# Patient Record
Sex: Male | Born: 1955 | Race: Black or African American | Hispanic: No | Marital: Single | Smoking: Current every day smoker
Health system: Southern US, Community
[De-identification: ages and names within clinical notes are randomized; demographics above are authoritative.]

## PROBLEM LIST (undated history)

## (undated) DIAGNOSIS — K219 Gastro-esophageal reflux disease without esophagitis: Secondary | ICD-10-CM

## (undated) DIAGNOSIS — Z720 Tobacco use: Secondary | ICD-10-CM

## (undated) DIAGNOSIS — E7800 Pure hypercholesterolemia, unspecified: Secondary | ICD-10-CM

## (undated) HISTORY — PX: SHOULDER ARTHROSCOPY: SHX128

---

## 2013-08-04 ENCOUNTER — Encounter (HOSPITAL_COMMUNITY): Payer: Self-pay | Admitting: Emergency Medicine

## 2013-08-04 ENCOUNTER — Emergency Department (HOSPITAL_COMMUNITY): Payer: Medicare Other

## 2013-08-04 ENCOUNTER — Emergency Department (HOSPITAL_COMMUNITY)
Admission: EM | Admit: 2013-08-04 | Discharge: 2013-08-04 | Disposition: A | Discharge status: Home / Self Care | Payer: Medicare Other | Attending: Emergency Medicine | Admitting: Emergency Medicine

## 2013-08-04 DIAGNOSIS — R51 Headache: Secondary | ICD-10-CM | POA: Insufficient documentation

## 2013-08-04 DIAGNOSIS — F1010 Alcohol abuse, uncomplicated: Secondary | ICD-10-CM | POA: Insufficient documentation

## 2013-08-04 DIAGNOSIS — J329 Chronic sinusitis, unspecified: Secondary | ICD-10-CM | POA: Diagnosis not present

## 2013-08-04 DIAGNOSIS — F10929 Alcohol use, unspecified with intoxication, unspecified: Secondary | ICD-10-CM

## 2013-08-04 DIAGNOSIS — R519 Headache, unspecified: Secondary | ICD-10-CM

## 2013-08-04 LAB — CBC
HEMATOCRIT: 45.3 % (ref 39.0–52.0)
Hemoglobin: 16.3 g/dL (ref 13.0–17.0)
MCH: 33.3 pg (ref 26.0–34.0)
MCHC: 36 g/dL (ref 30.0–36.0)
MCV: 92.6 fL (ref 78.0–100.0)
PLATELETS: 315 10*3/uL (ref 150–400)
RBC: 4.89 MIL/uL (ref 4.22–5.81)
RDW: 14 % (ref 11.5–15.5)
WBC: 9.8 10*3/uL (ref 4.0–10.5)

## 2013-08-04 LAB — I-STAT TROPONIN, ED: Troponin i, poc: 0.01 ng/mL (ref 0.00–0.08)

## 2013-08-04 LAB — ETHANOL: Alcohol, Ethyl (B): 191 mg/dL — ABNORMAL HIGH (ref 0–11)

## 2013-08-04 MED ORDER — SODIUM CHLORIDE 0.9 % IV BOLUS (SEPSIS)
1000.0000 mL | Freq: Once | INTRAVENOUS | Status: AC
  Administered 2013-08-04: 1000 mL via INTRAVENOUS

## 2013-08-04 MED ORDER — PSEUDOEPHEDRINE HCL ER 120 MG PO TB12: 120.0000 mg | ORAL_TABLET | Freq: Two times a day (BID) | ORAL | Status: DC

## 2013-08-04 MED ORDER — FENTANYL CITRATE 0.05 MG/ML IJ SOLN
50.0000 ug | Freq: Once | INTRAMUSCULAR | Status: AC
  Administered 2013-08-04: 50 ug via INTRAVENOUS
  Filled 2013-08-04: qty 2

## 2013-08-04 MED ORDER — AMOXICILLIN 500 MG PO CAPS: 500.0000 mg | ORAL_CAPSULE | Freq: Three times a day (TID) | ORAL | Status: DC

## 2013-08-04 NOTE — ED Notes (Signed)
Per EMS Pt called for treatment of HA and Pain caused by acid reflux. Pt reported to be non-compliant with medication for acid reflux. On arrival Pt hostile and would not cooperate the staff.

## 2013-08-04 NOTE — ED Notes (Signed)
Pt states understanding of discharge instructions 

## 2013-08-04 NOTE — ED Notes (Signed)
Manly MD at bedside. 

## 2013-08-04 NOTE — ED Provider Notes (Signed)
CSN: [id number]     Arrival date & time 08/04/13  0131 History   None    Chief Complaint  Patient presents with  . Headache  . Gastrophageal Reflux     (Consider location/radiation/quality/duration/timing/severity/associated sxs/prior Treatment) HPI Patient is a 58 yo self proclaimed heavy drinker who presents with about 24 hrs of right sided headache. Says that pain is severe. Began while at rest. Gradual onset. No fever, no visual changes. No photosensitivity, nausea or vomiting. No history of same. Pain is nonradiating.   Patient also says he had burning discomfort in his lower chest earlier in the evening which felt consistent with previous bouts of acid reflux.   No past medical history on file. No past surgical history on file. No family history on file. History  Substance Use Topics  . Smoking status: Not on file  . Smokeless tobacco: Not on file  . Alcohol Use: Not on file    Review of Systems Ten point review of symptoms performed and is negative with the exception of symptoms noted above.    Allergies  Review of patient's allergies indicates not on file.  Home Medications  No current outpatient prescriptions on file. BP 140/81  Temp(Src) 97.7 F (36.5 C) (Oral)  Resp 15  SpO2 98% Physical Exam Gen: well developed and well nourished appearing Head: NCAT Eyes: PERL, EOMI, normal fundoscopic exam Nose: no epistaixis or rhinorrhea Mouth/throat: mucosa is moist and pink Neck: supple, no stridor Lungs: CTA B, no wheezing, rhonchi or rales CV: regular rate and rythm, good distal pulses.  Abd: soft, notender, nondistended Back: no ttp, no cva ttp Skin: warm and dry Ext: no edema, normal to inspection Neuro: CN ii-xii grossly intact, normal speech, normal gait, 5/5 strength both arms and legs,  no focal deficits Psyche; mildly agitated appearingl affect,  calm and cooperative.  ED Course  Procedures (including critical care time) Labs Review  Results for  orders placed during the hospital encounter of 08/04/13 (from the past 24 hour(s))  CBC     Status: None   Collection Time    08/04/13  1:45 AM      Result Value Ref Range   WBC 9.8  4.0 - 10.5 K/uL   RBC 4.89  4.22 - 5.81 MIL/uL   Hemoglobin 16.3  13.0 - 17.0 g/dL   HCT 57.845.3  46.939.0 - 62.952.0 %   MCV 92.6  78.0 - 100.0 fL   MCH 33.3  26.0 - 34.0 pg   MCHC 36.0  30.0 - 36.0 g/dL   RDW 52.814.0  41.311.5 - 24.415.5 %   Platelets 315  150 - 400 K/uL  I-STAT TROPOININ, ED     Status: None   Collection Time    08/04/13  2:50 AM      Result Value Ref Range   Troponin i, poc 0.01  0.00 - 0.08 ng/mL   Comment 3            EKG: nsr, no acute ischemic changes, normal intervals, normal axis, normal qrs complex with LVH pattern, signs of biatrial enlargement.    CT Head Wo Contrast (Final result)  Result time: 08/04/13 03:36:48    Final result by Rad Results In Interface (08/04/13 03:36:48)    Narrative:   CLINICAL DATA: Headache. Alcohol intoxication. Acid reflux.  EXAM: CT HEAD WITHOUT CONTRAST  TECHNIQUE: Contiguous axial images were obtained from the base of the skull through the vertex without intravenous contrast.  COMPARISON: None.  FINDINGS: The  ventricles and sulci are normal. No intraparenchymal hemorrhage, mass effect nor midline shift. No acute large vascular territory infarcts.  No abnormal extra-axial fluid collections. Basal cisterns are patent.  No skull fracture. The included ocular globes and orbital contents are non-suspicious. Paranasal sinus mucosal with left sphenoid thickening frothy secretions. The mastoid air cells are well aerated.  IMPRESSION: No acute intracranial process. Normal noncontrast CT of the head for age.  Mild paranasal sinusitis.   Electronically Signed By: Awilda Metroourtnay Bloomer On: 08/04/2013 03:36      MDM   DDX: tension headache, migraine headache, SAH, space occupying lesion.    CT brain pending. Labs and EKG ordered from triage  unremarkable.   0430: CT brain is negative for ICH. Notable for bilateral sinusitis which is likely the cause of the patient's headache. Patient also noted to have an elevated blood alcohol level. Labs otherwise reassuring. Patient is feeling better after symptomatic management and we will treat with Amoxicillin as outpatient for sinusitis. [facility]. Counseled re : return precautions.   Brandt LoosenJulie Rainah Kirshner, MD 08/04/13 [phone]

## 2014-03-17 ENCOUNTER — Emergency Department (HOSPITAL_COMMUNITY): Payer: Medicare Other

## 2014-03-17 ENCOUNTER — Encounter (HOSPITAL_COMMUNITY): Payer: Self-pay | Admitting: Emergency Medicine

## 2014-03-17 ENCOUNTER — Emergency Department (HOSPITAL_COMMUNITY)
Admission: EM | Admit: 2014-03-17 | Discharge: 2014-03-17 | Disposition: A | Discharge status: Home / Self Care | Payer: Medicare Other | Attending: Emergency Medicine | Admitting: Emergency Medicine

## 2014-03-17 DIAGNOSIS — M545 Low back pain, unspecified: Secondary | ICD-10-CM

## 2014-03-17 DIAGNOSIS — M5442 Lumbago with sciatica, left side: Secondary | ICD-10-CM | POA: Diagnosis not present

## 2014-03-17 DIAGNOSIS — Z791 Long term (current) use of non-steroidal anti-inflammatories (NSAID): Secondary | ICD-10-CM | POA: Diagnosis not present

## 2014-03-17 DIAGNOSIS — Z79899 Other long term (current) drug therapy: Secondary | ICD-10-CM | POA: Diagnosis not present

## 2014-03-17 DIAGNOSIS — Z720 Tobacco use: Secondary | ICD-10-CM | POA: Insufficient documentation

## 2014-03-17 DIAGNOSIS — G8929 Other chronic pain: Secondary | ICD-10-CM | POA: Diagnosis not present

## 2014-03-17 DIAGNOSIS — Z792 Long term (current) use of antibiotics: Secondary | ICD-10-CM | POA: Diagnosis not present

## 2014-03-17 DIAGNOSIS — M549 Dorsalgia, unspecified: Secondary | ICD-10-CM | POA: Diagnosis present

## 2014-03-17 MED ORDER — OXYCODONE-ACETAMINOPHEN 10-325 MG PO TABS: 1.0000 | ORAL_TABLET | ORAL | Status: DC | PRN

## 2014-03-17 MED ORDER — FENTANYL CITRATE 0.05 MG/ML IJ SOLN
50.0000 ug | Freq: Once | INTRAMUSCULAR | Status: AC
  Administered 2014-03-17: 50 ug via INTRAMUSCULAR
  Filled 2014-03-17: qty 2

## 2014-03-17 MED ORDER — DIAZEPAM 5 MG PO TABS
10.0000 mg | ORAL_TABLET | Freq: Once | ORAL | Status: AC
  Administered 2014-03-17: 10 mg via ORAL
  Filled 2014-03-17: qty 2

## 2014-03-17 MED ORDER — METHOCARBAMOL 500 MG PO TABS: 500.0000 mg | ORAL_TABLET | Freq: Two times a day (BID) | ORAL | Status: DC

## 2014-03-17 MED ORDER — OXYCODONE-ACETAMINOPHEN 5-325 MG PO TABS
2.0000 | ORAL_TABLET | Freq: Once | ORAL | Status: AC
  Administered 2014-03-17: 2 via ORAL
  Filled 2014-03-17: qty 2

## 2014-03-17 NOTE — Discharge Instructions (Signed)
1. Medications: robaxin, percocet, usual home medications 2. Treatment: rest, drink plenty of fluids, gentle stretching as discussed, alternate ice and heat 3. Follow Up: Please followup with your primary doctor in 3 days for discussion of your diagnoses and further evaluation after today's visit; if you do not have a primary care doctor use the resource guide provided to find one;  Return to the ER for worsening back pain, difficulty walking, loss of bowel or bladder control or other concerning symptoms   Back Exercises Back exercises help treat and prevent back injuries. The goal of back exercises is to increase the strength of your abdominal and back muscles and the flexibility of your back. These exercises should be started when you no longer have back pain. Back exercises include:  Pelvic Tilt. Lie on your back with your knees bent. Tilt your pelvis until the lower part of your back is against the floor. Hold this position 5 to 10 sec and repeat 5 to 10 times.  Knee to Chest. Pull first 1 knee up against your chest and hold for 20 to 30 seconds, repeat this with the other knee, and then both knees. This may be done with the other leg straight or bent, whichever feels better.  Sit-Ups or Curl-Ups. Bend your knees 90 degrees. Start with tilting your pelvis, and do a partial, slow sit-up, lifting your trunk only 30 to 45 degrees off the floor. Take at least 2 to 3 seconds for each sit-up. Do not do sit-ups with your knees out straight. If partial sit-ups are difficult, simply do the above but with only tightening your abdominal muscles and holding it as directed.  Hip-Lift. Lie on your back with your knees flexed 90 degrees. Push down with your feet and shoulders as you raise your hips a couple inches off the floor; hold for 10 seconds, repeat 5 to 10 times.  Back arches. Lie on your stomach, propping yourself up on bent elbows. Slowly press on your hands, causing an arch in your low back. Repeat 3  to 5 times. Any initial stiffness and discomfort should lessen with repetition over time.  Shoulder-Lifts. Lie face down with arms beside your body. Keep hips and torso pressed to floor as you slowly lift your head and shoulders off the floor. Do not overdo your exercises, especially in the beginning. Exercises may cause you some mild back discomfort which lasts for a few minutes; however, if the pain is more severe, or lasts for more than 15 minutes, do not continue exercises until you see your caregiver. Improvement with exercise therapy for back problems is slow.  See your caregivers for assistance with developing a proper back exercise program. Document Released: 05/18/2004 Document Revised: 07/03/2011 Document Reviewed: 02/09/2011 [facility] Patient Information 2015 [employer]. This information is not intended to replace advice given to you by your health care provider. Make sure you discuss any questions you have with your health care provider.

## 2014-03-17 NOTE — ED Notes (Signed)
The patient said he has had back problems for about 15years.   He advises he has DJD.  The patient said his back hurts so much he can not take it anymore.  His leg has felt numb before but not this bad.
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
## 2014-05-08 ENCOUNTER — Emergency Department (HOSPITAL_COMMUNITY): Payer: Medicare Other

## 2014-05-08 ENCOUNTER — Emergency Department (HOSPITAL_COMMUNITY)
Admission: EM | Admit: 2014-05-08 | Discharge: 2014-05-08 | Disposition: A | Discharge status: Home / Self Care | Payer: Medicare Other | Attending: Emergency Medicine | Admitting: Emergency Medicine

## 2014-05-08 ENCOUNTER — Encounter (HOSPITAL_COMMUNITY): Payer: Self-pay | Admitting: *Deleted

## 2014-05-08 DIAGNOSIS — W010XXA Fall on same level from slipping, tripping and stumbling without subsequent striking against object, initial encounter: Secondary | ICD-10-CM | POA: Insufficient documentation

## 2014-05-08 DIAGNOSIS — K219 Gastro-esophageal reflux disease without esophagitis: Secondary | ICD-10-CM | POA: Insufficient documentation

## 2014-05-08 DIAGNOSIS — S8992XA Unspecified injury of left lower leg, initial encounter: Secondary | ICD-10-CM | POA: Diagnosis not present

## 2014-05-08 DIAGNOSIS — Y998 Other external cause status: Secondary | ICD-10-CM | POA: Diagnosis not present

## 2014-05-08 DIAGNOSIS — E780 Pure hypercholesterolemia: Secondary | ICD-10-CM | POA: Diagnosis not present

## 2014-05-08 DIAGNOSIS — Y9389 Activity, other specified: Secondary | ICD-10-CM | POA: Diagnosis not present

## 2014-05-08 DIAGNOSIS — S4992XA Unspecified injury of left shoulder and upper arm, initial encounter: Secondary | ICD-10-CM | POA: Insufficient documentation

## 2014-05-08 DIAGNOSIS — Z720 Tobacco use: Secondary | ICD-10-CM | POA: Insufficient documentation

## 2014-05-08 DIAGNOSIS — M25562 Pain in left knee: Secondary | ICD-10-CM

## 2014-05-08 DIAGNOSIS — S3992XA Unspecified injury of lower back, initial encounter: Secondary | ICD-10-CM | POA: Insufficient documentation

## 2014-05-08 DIAGNOSIS — Y92512 Supermarket, store or market as the place of occurrence of the external cause: Secondary | ICD-10-CM | POA: Insufficient documentation

## 2014-05-08 DIAGNOSIS — T1490XA Injury, unspecified, initial encounter: Secondary | ICD-10-CM

## 2014-05-08 DIAGNOSIS — W19XXXA Unspecified fall, initial encounter: Secondary | ICD-10-CM

## 2014-05-08 HISTORY — DX: Gastro-esophageal reflux disease without esophagitis: K21.9

## 2014-05-08 HISTORY — DX: Pure hypercholesterolemia, unspecified: E78.00

## 2014-05-08 MED ORDER — METHOCARBAMOL 500 MG PO TABS: 500.0000 mg | ORAL_TABLET | Freq: Two times a day (BID) | ORAL | Status: DC

## 2014-05-08 MED ORDER — OXYCODONE-ACETAMINOPHEN 5-325 MG PO TABS: 1.0000 | ORAL_TABLET | ORAL | Status: DC | PRN

## 2014-05-08 NOTE — Discharge Instructions (Signed)
Cryotherapy °Cryotherapy means treatment with cold. Ice or gel packs can be used to reduce both pain and swelling. Ice is the most helpful within the first 24 to 48 hours after an injury or flare-up from overusing a muscle or joint. Sprains, strains, spasms, burning pain, shooting pain, and aches can all be eased with ice. Ice can also be used when recovering from surgery. Ice is effective, has very few side effects, and is safe for most people to use. °PRECAUTIONS  °Ice is not a safe treatment option for people with: °· Raynaud phenomenon. This is a condition affecting small blood vessels in the extremities. Exposure to cold may cause your problems to return. °· Cold hypersensitivity. There are many forms of cold hypersensitivity, including: °¨ Cold urticaria. Red, itchy hives appear on the skin when the tissues begin to warm after being iced. °¨ Cold erythema. This is a red, itchy rash caused by exposure to cold. °¨ Cold hemoglobinuria. Red blood cells break down when the tissues begin to warm after being iced. The hemoglobin that carry oxygen are passed into the urine because they cannot combine with blood proteins fast enough. °· Numbness or altered sensitivity in the area being iced. °If you have any of the following conditions, do not use ice until you have discussed cryotherapy with your caregiver: °· Heart conditions, such as arrhythmia, angina, or chronic heart disease. °· High blood pressure. °· Healing wounds or open skin in the area being iced. °· Current infections. °· Rheumatoid arthritis. °· Poor circulation. °· Diabetes. °Ice slows the blood flow in the region it is applied. This is beneficial when trying to stop inflamed tissues from spreading irritating chemicals to surrounding tissues. However, if you expose your skin to cold temperatures for too long or without the proper protection, you can damage your skin or nerves. Watch for signs of skin damage due to cold. °HOME CARE INSTRUCTIONS °Follow  these tips to use ice and cold packs safely. °· Place a dry or damp towel between the ice and skin. A damp towel will cool the skin more quickly, so you may need to shorten the time that the ice is used. °· For a more rapid response, add gentle compression to the ice. °· Ice for no more than 10 to 20 minutes at a time. The bonier the area you are icing, the less time it will take to get the benefits of ice. °· Check your skin after 5 minutes to make sure there are no signs of a poor response to cold or skin damage. °· Rest 20 minutes or more between uses. °· Once your skin is numb, you can end your treatment. You can test numbness by very lightly touching your skin. The touch should be so light that you do not see the skin dimple from the pressure of your fingertip. When using ice, most people will feel these normal sensations in this order: cold, burning, aching, and numbness. °· Do not use ice on someone who cannot communicate their responses to pain, such as small children or people with dementia. °HOW TO MAKE AN ICE PACK °Ice packs are the most common way to use ice therapy. Other methods include ice massage, ice baths, and cryosprays. Muscle creams that cause a cold, tingly feeling do not offer the same benefits that ice offers and should not be used as a substitute unless recommended by your caregiver. °To make an ice pack, do one of the following: °· Place crushed ice or a   bag of frozen vegetables in a sealable plastic bag. Squeeze out the excess air. Place this bag inside another plastic bag. Slide the bag into a pillowcase or place a damp towel between your skin and the bag. °· Mix 3 parts water with 1 part rubbing alcohol. Freeze the mixture in a sealable plastic bag. When you remove the mixture from the freezer, it will be slushy. Squeeze out the excess air. Place this bag inside another plastic bag. Slide the bag into a pillowcase or place a damp towel between your skin and the bag. °SEEK MEDICAL CARE  IF: °· You develop white spots on your skin. This may give the skin a blotchy (mottled) appearance. °· Your skin turns blue or pale. °· Your skin becomes waxy or hard. °· Your swelling gets worse. °MAKE SURE YOU:  °· Understand these instructions. °· Will watch your condition. °· Will get help right away if you are not doing well or get worse. °Document Released: 12/05/2010 Document Revised: 08/25/2013 Document Reviewed: 12/05/2010 °[employer]® Patient Information ©2015 [employer]. This information is not intended to replace advice given to you by your health care provider. Make sure you discuss any questions you have with your health care provider. ° °
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
## 2018-07-11 ENCOUNTER — Ambulatory Visit (HOSPITAL_COMMUNITY)
Admission: EM | Disposition: A | Discharge status: Home / Self Care | Payer: Self-pay | Source: Home / Self Care | Attending: Emergency Medicine

## 2018-07-11 ENCOUNTER — Observation Stay (HOSPITAL_COMMUNITY)
Admission: EM | Admit: 2018-07-11 | Discharge: 2018-07-12 | Disposition: A | Discharge status: Home / Self Care | Payer: Medicare PPO | Attending: Cardiovascular Disease | Admitting: Cardiovascular Disease

## 2018-07-11 ENCOUNTER — Encounter (HOSPITAL_COMMUNITY): Payer: Self-pay

## 2018-07-11 ENCOUNTER — Emergency Department (HOSPITAL_COMMUNITY): Payer: Medicare PPO

## 2018-07-11 DIAGNOSIS — M4854XA Collapsed vertebra, not elsewhere classified, thoracic region, initial encounter for fracture: Secondary | ICD-10-CM | POA: Diagnosis not present

## 2018-07-11 DIAGNOSIS — K219 Gastro-esophageal reflux disease without esophagitis: Secondary | ICD-10-CM | POA: Insufficient documentation

## 2018-07-11 DIAGNOSIS — Z823 Family history of stroke: Secondary | ICD-10-CM | POA: Diagnosis not present

## 2018-07-11 DIAGNOSIS — E785 Hyperlipidemia, unspecified: Secondary | ICD-10-CM | POA: Diagnosis not present

## 2018-07-11 DIAGNOSIS — I2510 Atherosclerotic heart disease of native coronary artery without angina pectoris: Secondary | ICD-10-CM | POA: Diagnosis not present

## 2018-07-11 DIAGNOSIS — Z955 Presence of coronary angioplasty implant and graft: Secondary | ICD-10-CM

## 2018-07-11 DIAGNOSIS — R072 Precordial pain: Secondary | ICD-10-CM | POA: Diagnosis not present

## 2018-07-11 DIAGNOSIS — F1410 Cocaine abuse, uncomplicated: Secondary | ICD-10-CM | POA: Insufficient documentation

## 2018-07-11 DIAGNOSIS — I214 Non-ST elevation (NSTEMI) myocardial infarction: Secondary | ICD-10-CM | POA: Diagnosis present

## 2018-07-11 DIAGNOSIS — F17210 Nicotine dependence, cigarettes, uncomplicated: Secondary | ICD-10-CM | POA: Insufficient documentation

## 2018-07-11 DIAGNOSIS — I25119 Atherosclerotic heart disease of native coronary artery with unspecified angina pectoris: Principal | ICD-10-CM | POA: Insufficient documentation

## 2018-07-11 DIAGNOSIS — Z8249 Family history of ischemic heart disease and other diseases of the circulatory system: Secondary | ICD-10-CM | POA: Insufficient documentation

## 2018-07-11 DIAGNOSIS — Z720 Tobacco use: Secondary | ICD-10-CM | POA: Diagnosis not present

## 2018-07-11 DIAGNOSIS — R079 Chest pain, unspecified: Secondary | ICD-10-CM | POA: Diagnosis present

## 2018-07-11 HISTORY — PX: CORONARY STENT INTERVENTION: CATH118234

## 2018-07-11 HISTORY — DX: Tobacco use: Z72.0

## 2018-07-11 HISTORY — PX: LEFT HEART CATH AND CORONARY ANGIOGRAPHY: CATH118249

## 2018-07-11 LAB — I-STAT TROPONIN, ED: TROPONIN I, POC: 0.27 ng/mL — AB (ref 0.00–0.08)

## 2018-07-11 LAB — BASIC METABOLIC PANEL
Anion gap: 11 (ref 5–15)
BUN: 7 mg/dL — AB (ref 8–23)
CO2: 26 mmol/L (ref 22–32)
CREATININE: 0.96 mg/dL (ref 0.61–1.24)
Calcium: 9 mg/dL (ref 8.9–10.3)
Chloride: 103 mmol/L (ref 98–111)
GFR calc Af Amer: >60 mL/min (ref >60)
GFR calc non Af Amer: >60 mL/min (ref >60)
Glucose, Bld: 98 mg/dL (ref 70–99)
Potassium: 3.7 mmol/L (ref 3.5–5.1)
SODIUM: 140 mmol/L (ref 135–145)

## 2018-07-11 LAB — CBC
HCT: 42 % (ref 39.0–52.0)
Hemoglobin: 13.9 g/dL (ref 13.0–17.0)
MCH: 30.5 pg (ref 26.0–34.0)
MCHC: 33.1 g/dL (ref 30.0–36.0)
MCV: 92.3 fL (ref 80.0–100.0)
Platelets: 319 10*3/uL (ref 150–400)
RBC: 4.55 MIL/uL (ref 4.22–5.81)
RDW: 14.1 % (ref 11.5–15.5)
WBC: 12.7 10*3/uL — AB (ref 4.0–10.5)
nRBC: 0 % (ref 0.0–0.2)

## 2018-07-11 LAB — POCT ACTIVATED CLOTTING TIME: ACTIVATED CLOTTING TIME: 274 s

## 2018-07-11 LAB — TROPONIN I: Troponin I: 0.41 ng/mL (ref <0.03)

## 2018-07-11 SURGERY — LEFT HEART CATH AND CORONARY ANGIOGRAPHY
Anesthesia: LOCAL

## 2018-07-11 MED ORDER — HEPARIN (PORCINE) IN NACL 1000-0.9 UT/500ML-% IV SOLN
INTRAVENOUS | Status: DC | PRN
  Administered 2018-07-11: 500 mL

## 2018-07-11 MED ORDER — SODIUM CHLORIDE 0.9 % IV SOLN: 250.0000 mL | INTRAVENOUS | Status: DC | PRN

## 2018-07-11 MED ORDER — HEPARIN (PORCINE) IN NACL 1000-0.9 UT/500ML-% IV SOLN
INTRAVENOUS | Status: AC
  Filled 2018-07-11: qty 1000

## 2018-07-11 MED ORDER — FENTANYL CITRATE (PF) 100 MCG/2ML IJ SOLN
INTRAMUSCULAR | Status: AC
  Filled 2018-07-11: qty 2

## 2018-07-11 MED ORDER — SODIUM CHLORIDE 0.9% FLUSH: 3.0000 mL | INTRAVENOUS | Status: DC | PRN

## 2018-07-11 MED ORDER — MIDAZOLAM HCL 2 MG/2ML IJ SOLN
INTRAMUSCULAR | Status: AC
  Filled 2018-07-11: qty 2

## 2018-07-11 MED ORDER — ASPIRIN 81 MG PO CHEW: 81.0000 mg | CHEWABLE_TABLET | ORAL | Status: DC

## 2018-07-11 MED ORDER — HEPARIN SODIUM (PORCINE) 1000 UNIT/ML IJ SOLN
INTRAMUSCULAR | Status: AC
  Filled 2018-07-11: qty 1

## 2018-07-11 MED ORDER — ONDANSETRON HCL 4 MG/2ML IJ SOLN: 4.0000 mg | Freq: Four times a day (QID) | INTRAMUSCULAR | Status: DC | PRN

## 2018-07-11 MED ORDER — SODIUM CHLORIDE 0.9 % WEIGHT BASED INFUSION: 1.0000 mL/kg/h | INTRAVENOUS | Status: DC

## 2018-07-11 MED ORDER — VERAPAMIL HCL 2.5 MG/ML IV SOLN
INTRAVENOUS | Status: DC | PRN
  Administered 2018-07-11: 10 mL via INTRA_ARTERIAL

## 2018-07-11 MED ORDER — LIDOCAINE HCL (PF) 1 % IJ SOLN
INTRAMUSCULAR | Status: DC | PRN
  Administered 2018-07-11: 20 mL

## 2018-07-11 MED ORDER — HEPARIN BOLUS VIA INFUSION
4000.0000 [IU] | Freq: Once | INTRAVENOUS | Status: AC
  Administered 2018-07-11: 4000 [IU] via INTRAVENOUS
  Filled 2018-07-11: qty 4000

## 2018-07-11 MED ORDER — TICAGRELOR 90 MG PO TABS
ORAL_TABLET | ORAL | Status: AC
  Filled 2018-07-11: qty 1

## 2018-07-11 MED ORDER — VERAPAMIL HCL 2.5 MG/ML IV SOLN
INTRAVENOUS | Status: AC
  Filled 2018-07-11: qty 2

## 2018-07-11 MED ORDER — HEPARIN (PORCINE) 25000 UT/250ML-% IV SOLN
1000.0000 [IU]/h | INTRAVENOUS | Status: DC
  Administered 2018-07-11: 1000 [IU]/h via INTRAVENOUS
  Filled 2018-07-11: qty 250

## 2018-07-11 MED ORDER — ASPIRIN 81 MG PO CHEW
81.0000 mg | CHEWABLE_TABLET | ORAL | Status: AC
  Administered 2018-07-11: 81 mg via ORAL
  Filled 2018-07-11: qty 1

## 2018-07-11 MED ORDER — SODIUM CHLORIDE 0.9 % IV SOLN: INTRAVENOUS | Status: AC

## 2018-07-11 MED ORDER — LIDOCAINE HCL (PF) 1 % IJ SOLN
INTRAMUSCULAR | Status: AC
  Filled 2018-07-11: qty 30

## 2018-07-11 MED ORDER — FAMOTIDINE 20 MG PO TABS
20.0000 mg | ORAL_TABLET | Freq: Every day | ORAL | Status: DC
  Administered 2018-07-12: 20 mg via ORAL
  Filled 2018-07-11: qty 1

## 2018-07-11 MED ORDER — TICAGRELOR 90 MG PO TABS
ORAL_TABLET | ORAL | Status: DC | PRN
  Administered 2018-07-11: 180 mg via ORAL

## 2018-07-11 MED ORDER — SODIUM CHLORIDE 0.9% FLUSH
3.0000 mL | Freq: Two times a day (BID) | INTRAVENOUS | Status: DC
  Administered 2018-07-11: 3 mL via INTRAVENOUS

## 2018-07-11 MED ORDER — NITROGLYCERIN 0.4 MG SL SUBL: 0.4000 mg | SUBLINGUAL_TABLET | SUBLINGUAL | Status: DC | PRN

## 2018-07-11 MED ORDER — ATORVASTATIN CALCIUM 80 MG PO TABS
80.0000 mg | ORAL_TABLET | Freq: Every day | ORAL | Status: DC
  Administered 2018-07-11: 80 mg via ORAL
  Filled 2018-07-11 (×2): qty 1

## 2018-07-11 MED ORDER — TICAGRELOR 90 MG PO TABS
90.0000 mg | ORAL_TABLET | Freq: Two times a day (BID) | ORAL | Status: DC
  Administered 2018-07-12: 90 mg via ORAL
  Filled 2018-07-11: qty 1

## 2018-07-11 MED ORDER — SODIUM CHLORIDE 0.9% FLUSH: 3.0000 mL | Freq: Two times a day (BID) | INTRAVENOUS | Status: DC

## 2018-07-11 MED ORDER — ACETAMINOPHEN 325 MG PO TABS: 650.0000 mg | ORAL_TABLET | ORAL | Status: DC | PRN

## 2018-07-11 MED ORDER — LABETALOL HCL 5 MG/ML IV SOLN: 10.0000 mg | INTRAVENOUS | Status: AC | PRN

## 2018-07-11 MED ORDER — METOPROLOL TARTRATE 12.5 MG HALF TABLET
12.5000 mg | ORAL_TABLET | Freq: Two times a day (BID) | ORAL | Status: DC
  Administered 2018-07-11 – 2018-07-12 (×3): 12.5 mg via ORAL
  Filled 2018-07-11 (×3): qty 1

## 2018-07-11 MED ORDER — NITROGLYCERIN 0.4 MG SL SUBL
0.4000 mg | SUBLINGUAL_TABLET | SUBLINGUAL | Status: DC | PRN
  Filled 2018-07-11: qty 1

## 2018-07-11 MED ORDER — NITROGLYCERIN 0.4 MG SL SUBL
0.4000 mg | SUBLINGUAL_TABLET | SUBLINGUAL | Status: DC | PRN
  Administered 2018-07-11: 0.4 mg via SUBLINGUAL

## 2018-07-11 MED ORDER — HEPARIN SODIUM (PORCINE) 1000 UNIT/ML IJ SOLN
INTRAMUSCULAR | Status: DC | PRN
  Administered 2018-07-11: 3000 [IU] via INTRAVENOUS
  Administered 2018-07-11: 4000 [IU] via INTRAVENOUS
  Administered 2018-07-11: 6000 [IU] via INTRAVENOUS

## 2018-07-11 MED ORDER — ASPIRIN EC 81 MG PO TBEC
81.0000 mg | DELAYED_RELEASE_TABLET | Freq: Every day | ORAL | Status: DC
  Administered 2018-07-12: 81 mg via ORAL
  Filled 2018-07-11: qty 1

## 2018-07-11 MED ORDER — FENTANYL CITRATE (PF) 100 MCG/2ML IJ SOLN
INTRAMUSCULAR | Status: DC | PRN
  Administered 2018-07-11 (×2): 25 ug via INTRAVENOUS
  Administered 2018-07-11: 50 ug via INTRAVENOUS

## 2018-07-11 MED ORDER — SODIUM CHLORIDE 0.9% FLUSH: 3.0000 mL | Freq: Once | INTRAVENOUS | Status: DC

## 2018-07-11 MED ORDER — SODIUM CHLORIDE 0.9 % WEIGHT BASED INFUSION
3.0000 mL/kg/h | INTRAVENOUS | Status: DC
  Administered 2018-07-11: 3 mL/kg/h via INTRAVENOUS

## 2018-07-11 MED ORDER — MIDAZOLAM HCL 2 MG/2ML IJ SOLN
INTRAMUSCULAR | Status: DC | PRN
  Administered 2018-07-11 (×2): 1 mg via INTRAVENOUS

## 2018-07-11 MED ORDER — NICOTINE 21 MG/24HR TD PT24
21.0000 mg | MEDICATED_PATCH | Freq: Every day | TRANSDERMAL | Status: DC
  Administered 2018-07-11: 21 mg via TRANSDERMAL
  Filled 2018-07-11: qty 1

## 2018-07-11 MED ORDER — HYDRALAZINE HCL 20 MG/ML IJ SOLN: 5.0000 mg | INTRAMUSCULAR | Status: AC | PRN

## 2018-07-11 MED ORDER — IOHEXOL 350 MG/ML SOLN
INTRAVENOUS | Status: DC | PRN
  Administered 2018-07-11: 130 mL via INTRAVENOUS

## 2018-07-11 SURGICAL SUPPLY — 16 items
BALLN EMERGE MR 2.5X12 (BALLOONS) ×2
BALLOON EMERGE MR 2.5X12 (BALLOONS) ×1 IMPLANT
CATH 5FR JL3.5 JR4 ANG PIG MP (CATHETERS) ×2 IMPLANT
CATH VISTA GUIDE 6FR XB3.5 (CATHETERS) ×2 IMPLANT
DEVICE RAD COMP TR BAND LRG (VASCULAR PRODUCTS) ×2 IMPLANT
GLIDESHEATH SLEND SS 6F .021 (SHEATH) ×4 IMPLANT
GUIDEWIRE INQWIRE 1.5J.035X260 (WIRE) ×1 IMPLANT
INQWIRE 1.5J .035X260CM (WIRE) ×2
KIT ENCORE 26 ADVANTAGE (KITS) ×2 IMPLANT
KIT HEART LEFT (KITS) ×2 IMPLANT
PACK CARDIAC CATHETERIZATION (CUSTOM PROCEDURE TRAY) ×2 IMPLANT
STENT RESOLUTE ONYX 2.75X18 (Permanent Stent) ×2 IMPLANT
SYR MEDRAD MARK 7 150ML (SYRINGE) ×2 IMPLANT
TRANSDUCER W/STOPCOCK (MISCELLANEOUS) ×2 IMPLANT
TUBING CIL FLEX 10 FLL-RA (TUBING) ×2 IMPLANT
WIRE COUGAR XT STRL 190CM (WIRE) ×2 IMPLANT

## 2018-07-11 NOTE — Progress Notes (Signed)
   Second troponin 0.41. We recommend cardiac catheterization to evaluate coronary arteries and proceed with PCI as indicated. This was discussed the the patient and he agrees. The patient understands that risks included but are not limited to stroke (1 in 1000), death (1 in 1000), kidney failure [usually temporary] (1 in 500), bleeding (1 in 200), allergic reaction [possibly serious] (1 in 200).   I offered to call his daughter and explain the plan but he does not wish me to. He states that he just wants to get on with it as he is very hungry and cranky.   Berton Bon, AGNP-C [facility] 07/11/2018  3:14 PM Pager: [phone]

## 2018-07-11 NOTE — Progress Notes (Signed)
ANTICOAGULATION CONSULT NOTE - Initial Consult  Pharmacy Consult for Heparin Indication: chest pain/ACS  No Known Allergies  Patient Measurements: Height: 5\' 9"  (175.3 cm) Weight: 180 lb (81.6 kg) IBW/kg (Calculated) : 70.7   Vital Signs: Temp: 97.7 F (36.5 C) (03/19 1125) Temp Source: Oral (03/19 1125) BP: 108/81 (03/19 1318) Pulse Rate: 90 (03/19 1318)  Labs: Recent Labs    07/11/18 1132  HGB 13.9  HCT 42.0  PLT 319  CREATININE 0.96    Estimated Creatinine Clearance: 80.8 mL/min (by C-G formula based on SCr of 0.96 mg/dL).   Medical History: Past Medical History:  Diagnosis Date  . GERD (gastroesophageal reflux disease)   . Hypercholesteremia   . Tobacco abuse     Assessment: Jowell Shorter is a 63 y.o. male with chest pain and mild shortness of breath. He has a history of GERD, hyperlipidemia, smoking and polysubstance abuse.  No prior cardiac history. First troponin 0.27.   Pharmacy consulted to start heparin drip.  Goal of Therapy:  Heparin level 0.3-0.7 units/ml Monitor platelets by anticoagulation protocol: Yes   Plan:  Give 4000 units bolus x 1 Start heparin infusion at 1000 units/hr Check anti-Xa level in 8 hours and daily while on heparin Continue to monitor H&H and platelets  Jeanella Cara, PharmD, [facility] Clinical Pharmacist Please see AMION for all Pharmacists' Contact Phone Numbers 07/11/2018, 1:52 PM

## 2018-07-11 NOTE — ED Notes (Signed)
Attempted report 

## 2018-07-11 NOTE — ED Triage Notes (Signed)
Pt presents with onset of chest pain that woke him from sleep this morning.  Pt reports pain has been constant, is mid-sternal and does not radiate.  +shortness of breath; pt describes as sore.

## 2018-07-11 NOTE — Interval H&P Note (Signed)
History and Physical Interval Note:  07/11/2018 4:32 PM  Joshua Reid  has presented today for cardiac cath with the diagnosis of NSTEMI. The various methods of treatment have been discussed with the patient and family. After consideration of risks, benefits and other options for treatment, the patient has consented to  Procedure(s): LEFT HEART CATH AND CORONARY ANGIOGRAPHY (N/A) as a surgical intervention.  The patient's history has been reviewed, patient examined, no change in status, stable for surgery.  I have reviewed the patient's chart and labs.  Questions were answered to the patient's satisfaction.    Cath Lab Visit (complete for each Cath Lab visit)  Clinical Evaluation Leading to the Procedure:   ACS: Yes.    Non-ACS:    Anginal Classification: CCS III  Anti-ischemic medical therapy: No Therapy  Non-Invasive Test Results: No non-invasive testing performed  Prior CABG: No previous CABG         Verne Carrow

## 2018-07-11 NOTE — ED Notes (Signed)
Patient transported to X-ray 

## 2018-07-11 NOTE — ED Provider Notes (Signed)
[facility] Provider Note   CSN: [id number] Arrival date & time: 07/11/18  1120    History   Chief Complaint Chief Complaint  Patient presents with  . Chest Pain    HPI Suzy BouchardJames Bowerman is a 63 y.o. male.     The history is provided by the patient.  Chest Pain  Pain location:  Substernal area Pain quality: aching and pressure   Pain radiates to:  Does not radiate Pain severity:  Moderate Onset quality:  Gradual Duration: 5. Timing:  Constant Progression:  Resolved Chronicity:  New Context comment:  Started while sleeping and woke him up this morning around 7:30 from sleep.  he states had some similar pain last night that got better when he got out of bed but today did not go away Relieved by:  None tried Worsened by:  Nothing Ineffective treatments:  None tried Associated symptoms: nausea and shortness of breath   Associated symptoms: no abdominal pain, no anorexia, no back pain, no cough, no diaphoresis, no dysphagia, no fever, no headache, no lower extremity edema, no vomiting and no weakness   Risk factors: high cholesterol and smoking   Risk factors comment:  Drinks 4-5 beers a day and more over the weekend.  smokes 2-3 cigarettes per day.  and uses cocoaine   Past Medical History:  Diagnosis Date  . GERD (gastroesophageal reflux disease)   . Hypercholesteremia     There are no active problems to display for this patient.   Past Surgical History:  Procedure Laterality Date  . SHOULDER ARTHROSCOPY          Home Medications    Prior to Admission medications   Medication Sig Start Date End Date Taking? Authorizing Provider  amoxicillin (AMOXIL) 500 MG capsule Take 1 capsule (500 mg total) by mouth 3 (three) times daily. Patient not taking: Reported on 03/17/2014 08/04/13   Brandt LoosenManly, Julie, MD  diclofenac (VOLTAREN) 75 MG EC tablet Take 75 mg by mouth 2 (two) times daily.    [provider]  methocarbamol  (ROBAXIN) 500 MG tablet Take 1 tablet (500 mg total) by mouth 2 (two) times daily. 05/08/14   Elpidio AnisUpstill, Shari, PA-C  oxyCODONE-acetaminophen (PERCOCET/ROXICET) 5-325 MG per tablet Take 1-2 tablets by mouth every 4 (four) hours as needed for severe pain. 05/08/14   Elpidio AnisUpstill, Shari, PA-C  pseudoephedrine (SUDAFED 12 HOUR) 120 MG 12 hr tablet Take 1 tablet (120 mg total) by mouth 2 (two) times daily. Patient not taking: Reported on 03/17/2014 08/04/13   Brandt LoosenManly, Julie, MD  Ranitidine HCl (RANITIDINE ACID REDUCER PO) Take 1 tablet by mouth daily as needed (for acid reflux).    [provider]  simvastatin (ZOCOR) 10 MG tablet Take 10 mg by mouth at bedtime.    [provider]    Family History History reviewed. No pertinent family history.  Social History Social History   Tobacco Use  . Smoking status: Current Every Day Smoker    Packs/day: 0.50    Types: Cigarettes  . Smokeless tobacco: Never Used  Substance Use Topics  . Alcohol use: Yes    Comment: pt states "as much as I can"  . Drug use: Yes    Types: Cocaine     Allergies   Patient has no known allergies.   Review of Systems Review of Systems  Constitutional: Negative for diaphoresis and fever.  HENT: Negative for trouble swallowing.   Respiratory: Positive for shortness of breath. Negative for cough.  Cardiovascular: Positive for chest pain.  Gastrointestinal: Positive for nausea. Negative for abdominal pain, anorexia and vomiting.  Musculoskeletal: Negative for back pain.  Neurological: Negative for weakness and headaches.  All other systems reviewed and are negative.    Physical Exam Updated Vital Signs BP 133/78 (BP Location: Right Arm)   Pulse 75   Temp 97.7 F (36.5 C) (Oral)   Resp 18   Ht 5\' 9"  (1.753 m)   Wt 81.6 kg   SpO2 94%   BMI 26.58 kg/m   Physical Exam Vitals signs and nursing note reviewed.  Constitutional:      General: He is not in acute distress.    Appearance: He is  well-developed.  HENT:     Head: Normocephalic and atraumatic.     Mouth/Throat:     Mouth: Mucous membranes are moist.  Eyes:     Conjunctiva/sclera: Conjunctivae normal.     Pupils: Pupils are equal, round, and reactive to light.  Neck:     Musculoskeletal: Normal range of motion and neck supple.  Cardiovascular:     Rate and Rhythm: Normal rate and regular rhythm.     Heart sounds: No murmur.  Pulmonary:     Effort: Pulmonary effort is normal. No respiratory distress.     Breath sounds: Normal breath sounds. No wheezing or rales.  Abdominal:     General: There is no distension.     Palpations: Abdomen is soft.     Tenderness: There is no abdominal tenderness. There is no guarding or rebound.  Musculoskeletal: Normal range of motion.        General: No tenderness.  Skin:    General: Skin is warm and dry.     Capillary Refill: Capillary refill takes less than 2 seconds.     Findings: No erythema or rash.  Neurological:     General: No focal deficit present.     Mental Status: He is alert and oriented to person, place, and time. Mental status is at baseline.  Psychiatric:        Mood and Affect: Mood normal.        Behavior: Behavior normal.        Thought Content: Thought content normal.      ED Treatments / Results  Labs (all labs ordered are listed, but only abnormal results are displayed) Labs Reviewed  CBC - Abnormal; Notable for the following components:      Result Value   WBC 12.7 (*)    All other components within normal limits  I-STAT TROPONIN, ED - Abnormal; Notable for the following components:   Troponin i, poc 0.27 (*)    All other components within normal limits  BASIC METABOLIC PANEL  RAPID URINE DRUG SCREEN, HOSP PERFORMED    EKG EKG Interpretation  Date/Time:  Thursday July 11 2018 11:33:16 EDT Ventricular Rate:  80 PR Interval:    QRS Duration: 88 QT Interval:  465 QTC Calculation: 537 R Axis:   53 Text Interpretation:  Sinus rhythm  Consider right atrial enlargement Prolonged QT interval No significant change since last tracing Confirmed by Gwyneth Sprout ([id number]) on 07/11/2018 11:38:55 AM   Radiology No results found.  Procedures Procedures (including critical care time)  Medications Ordered in ED Medications  sodium chloride flush (NS) 0.9 % injection 3 mL (has no administration in time range)     Initial Impression / Assessment and Plan / ED Course  I have reviewed the triage vital signs and the nursing  notes.  Pertinent labs & imaging results that were available during my care of the patient were reviewed by me and considered in my medical decision making (see chart for details).       Patient is a 63 year old male presenting with chest pain today that woke him from sleep.  Sounds like he did have an episode last night when he tried to sleep as well.  Did not go away when he got out of bed and cause some mild shortness of breath.  Patient received aspirin and 2 nitroglycerin in route and states now he has absolutely no pain.  He does have history of high cholesterol and he is a polysubstance user including alcohol daily, cocaine occasionally and smokes cigarettes daily.  He has no known cardiac history and no family history.  He states he has never had pain like this before.  He does take medication for GERD.  He denies using NSAIDs, BC powder or other stomach irritating medications.  Vital signs are normal on exam.  No focal findings on exam.  Low suspicion for PE, pneumonia, dissection, pneumothorax.  Will rule out ACS but also in the differential is GERD.  Patient's EKG is unchanged with known prolonged QT interval.  Trop is elevated today at 0.27 and pain was present for 3 hours.  Will give heparin and call cardiology.  Pt currently pain free.  Last cocaine use 1 month ago but will do a UDS.  Cards to see.  CRITICAL CARE Performed by: Emmalea Treanor Total critical care time: 30 minutes Critical care time  was exclusive of separately billable procedures and treating other patients. Critical care was necessary to treat or prevent imminent or life-threatening deterioration. Critical care was time spent personally by me on the following activities: development of treatment plan with patient and/or surrogate as well as nursing, discussions with consultants, evaluation of patient's response to treatment, examination of patient, obtaining history from patient or surrogate, ordering and performing treatments and interventions, ordering and review of laboratory studies, ordering and review of radiographic studies, pulse oximetry and re-evaluation of patient's condition.   Final Clinical Impressions(s) / ED Diagnoses   Final diagnoses:  NSTEMI (non-ST elevated myocardial infarction) [facility])    ED Discharge Orders    None       Gwyneth Sprout, MD 07/11/18 1206

## 2018-07-11 NOTE — H&P (Addendum)
Cardiology Admission History and Physical:   Patient ID: Joshua Reid MRN: [id number]; DOB: 05/13/1955   Admission date: 07/11/2018  Primary Care Provider: Patient, No Pcp Per Primary Cardiologist: Thurmon Fair, MD - New Primary Electrophysiologist:  None   Chief Complaint:  Chest pain  Patient Profile:   Joshua Reid is a 63 y.o. male with chest pain and mild shortness of breath. He has a history of GERD, hyperlipidemia, smoking and polysubstance abuse.  No prior cardiac history. First troponin 0.27.   History of Present Illness:   Joshua Reid has a history of hyperlipidemia, GERD, smoking and polysubstance abuse.  He denies any prior cardiac history.  He says that he has never been sick before.  He has had some medications prescribed for him in the past for cholesterol and other things but he has little regard for healthcare and does not take any of those medications.  He is skeptical about being here right now.  He says that he is on disability for shoulder and back problems.  At baseline he does yard work and he walks for transportation as he does not have a Information systems manager.  Usually he has no exertional chest discomfort or shortness of breath with activity.  This morning the patient awoke with substernal chest pressure/aching with mild shortness of breath.  He notes that he was also a little sweaty and felt a lump in his throat.  He called 911 and his pain was resolved en route to the hospital with aspirin and 2 sublingual nitroglycerin.  He had not had any further chest pain until during my examination his symptoms have returned, but are milder.  During my exam the patient notes that he has tenderness of the chest wall when I push my stethoscope.  He has tenderness when I gently push my hand against his anterior chest.  He does say that this is what he has been feeling. He denies any injury to his chest or falls. He has no palpitations, orthopnea, PND, syncope, dizziness, swelling or leg  pain with walking.   The patient currently smokes about 3 to 4 cigarettes/week, only when he is drinking.  He used to smoke a pack per day until about 2 to 3 years ago.  He drinks at least 1-2 beers per day and denies any other recent substance use.  His parents are both deceased.  He he thinks that both of them had a stroke but no heart issues.  He has 2 brothers and 4 sisters for which he knows none of their health information.  He is sure that none of them have had a heart attack.  His PCP is in [place] in [place].    Past Medical History:  Diagnosis Date  . GERD (gastroesophageal reflux disease)   . Hypercholesteremia   . Tobacco abuse     Past Surgical History:  Procedure Laterality Date  . SHOULDER ARTHROSCOPY       Medications Prior to Admission: Prior to Admission medications   Medication Sig Start Date End Date Taking? Authorizing Provider  ranitidine (ZANTAC) 150 MG tablet Take 150 mg by mouth every morning.   Yes [provider]  methocarbamol (ROBAXIN) 500 MG tablet Take 1 tablet (500 mg total) by mouth 2 (two) times daily. Patient not taking: Reported on 07/11/2018 05/08/14   Elpidio Anis, PA-C  oxyCODONE-acetaminophen (PERCOCET/ROXICET) 5-325 MG per tablet Take 1-2 tablets by mouth every 4 (four) hours as needed for severe pain. Patient not taking: Reported on 07/11/2018  05/08/14   Elpidio AnisUpstill, Shari, PA-C  pseudoephedrine (SUDAFED 12 HOUR) 120 MG 12 hr tablet Take 1 tablet (120 mg total) by mouth 2 (two) times daily. Patient not taking: Reported on 03/17/2014 08/04/13   Brandt LoosenManly, Julie, MD     Allergies:   No Known Allergies  Social History:   Social History   Socioeconomic History  . Marital status: Single    Spouse name: Not on file  . Number of children: Not on file  . Years of education: Not on file  . Highest education level: Not on file  Occupational History  . Not on file  Social Needs  . Financial resource strain: Not on file  . Food  insecurity:    Worry: Not on file    Inability: Not on file  . Transportation needs:    Medical: Not on file    Non-medical: Not on file  Tobacco Use  . Smoking status: Current Every Day Smoker    Packs/day: 0.50    Types: Cigarettes  . Smokeless tobacco: Never Used  Substance and Sexual Activity  . Alcohol use: Yes    Comment: pt states "as much as I can"  . Drug use: Yes    Types: Cocaine  . Sexual activity: Not on file  Lifestyle  . Physical activity:    Days per week: Not on file    Minutes per session: Not on file  . Stress: Not on file  Relationships  . Social connections:    Talks on phone: Not on file    Gets together: Not on file    Attends religious service: Not on file    Active member of club or organization: Not on file    Attends meetings of clubs or organizations: Not on file    Relationship status: Not on file  . Intimate partner violence:    Fear of current or ex partner: Not on file    Emotionally abused: Not on file    Physically abused: Not on file    Forced sexual activity: Not on file  Other Topics Concern  . Not on file  Social History Narrative  . Not on file    Family History:   The patient's family history includes Diabetes in his mother; Hypertension in his mother; Stroke in his father and mother.    ROS:  Please see the history of present illness.  All other ROS reviewed and negative.     Physical Exam/Data:   Vitals:   07/11/18 1127 07/11/18 1231 07/11/18 1313 07/11/18 1318  BP:  (!) 141/82 (!) 155/81 108/81  Pulse:  68 72 90  Resp:  13 14 13   Temp:      TempSrc:      SpO2:  98% 98% 97%  Weight: 81.6 kg     Height: 5\' 9"  (1.753 m)      No intake or output data in the 24 hours ending 07/11/18 1344 Last 3 Weights 07/11/2018 03/17/2014  Weight (lbs) 180 lb 174 lb  Weight (kg) 81.647 kg 78.926 kg     Body mass index is 26.58 kg/m.  General:  Well nourished, well developed, in no acute distress HEENT: normal Lymph: no  adenopathy Neck: no JVD Endocrine:  No thryomegaly Vascular: No carotid bruits; FA pulses 2+ bilaterally without bruits  Cardiac:  normal S1, S2; RRR; no murmur  Lungs:  clear to auscultation bilaterally, no wheezing, rhonchi or rales  Abd: soft, nontender, no hepatomegaly  Ext: no edema Musculoskeletal:  No deformities, BUE and BLE strength normal and equal Skin: warm and dry  Neuro:  CNs 2-12 intact, no focal abnormalities noted Psych:  Normal affect    EKG:  The ECG that was done today was personally reviewed and demonstrates Sinus rhythm, 80 bpm, Consider right atrial enlargement Prolonged QT interval, QTC 537  Relevant CV Studies:  None  Laboratory Data:  Chemistry Recent Labs  Lab 07/11/18 1132  NA 140  K 3.7  CL 103  CO2 26  GLUCOSE 98  BUN 7*  CREATININE 0.96  CALCIUM 9.0  GFRNONAA >60  GFRAA >60  ANIONGAP 11    No results for input(s): PROT, ALBUMIN, AST, ALT, ALKPHOS, BILITOT in the last 168 hours. Hematology Recent Labs  Lab 07/11/18 1132  WBC 12.7*  RBC 4.55  HGB 13.9  HCT 42.0  MCV 92.3  MCH 30.5  MCHC 33.1  RDW 14.1  PLT 319   Cardiac EnzymesNo results for input(s): TROPONINI in the last 168 hours.  Recent Labs  Lab 07/11/18 1137  TROPIPOC 0.27*    BNPNo results for input(s): BNP, PROBNP in the last 168 hours.  DDimer No results for input(s): DDIMER in the last 168 hours.  Radiology/Studies:  No results found.  Assessment and Plan:   Chest pain/NSTEMI -Pt presented with episode of chest pain and mild shortness of breath and possible mild episode last night.  Patient had a recurrence of central chest pressure/aching with mild shortness of breath and feeling like a lump in his throat during my exam. He is also tender to palpation of the chest.  -CVD risk factors include HLD, smoking and polysubstance abuse.  -first (POC) troponin elevated at 0.27. Renal function normal.  -EKG without acute ischemic changes. -Patient has been given  aspirin and IV heparin is infusing. Will use SL NTG as needed. Initiate low dose metoprolol. HR in the 60's. BP stable.  -Current chest pain again resolved with NTG.  -We will check a lab troponin. If elevated will consider taking pt to cath lab today vs waiting until tomorrow. If lab troponin is totally normal then can consider stress test tomorrow.   Hyperlipidemia -Not on treatment.  Patient cannot tell me the details other than he stopped taking his medication at some point. -Will check FLP in am. Will initiated statin.   Tobacco abuse -Patient was a 1 pack/day smoker until about 2-3 years ago.  He currently smokes about 3-4 cigarettes/week, only when he is drinking. -Encourage complete cessation.   GERD -takes ranitidine.   Severity of Illness: The appropriate patient status for this patient is OBSERVATION. Observation status is judged to be reasonable and necessary in order to provide the required intensity of service to ensure the patient's safety. The patient's presenting symptoms, physical exam findings, and initial radiographic and laboratory data in the context of their medical condition is felt to place them at decreased risk for further clinical deterioration. Furthermore, it is anticipated that the patient will be medically stable for discharge from the hospital within 2 midnights of admission. The following factors support the patient status of observation.   " The patient's presenting symptoms include chest pain, shortness of breath. " The physical exam findings include normal exam. " The initial radiographic and laboratory data are POC troponin elevated at 0.27, non-ischemic EKG.     For questions or updates, please contact [facility] Please consult [email or web address] for contact info under        Signed, Berton Bon, NP  07/11/2018  1:44 PM   I have seen and examined the patient along with Berton Bon, NP.  I have reviewed the chart, notes and new data.  I agree  with NP's note.  Key new complaints: not a very eager historian. Symptoms are atypical, but did improve with SL NTG. Key examination changes: normal CV exam Key new findings / data: POC troponin mildly abnormal, need to check a phlebotomy sample.   PLAN: If true troponin value is elevated, plan cardiac catheterization. Otherwise plan nuclear stress test in AM.  Thurmon Fair, MD, [facility] [phone] 07/11/2018, 1:45 PM

## 2018-07-11 NOTE — ED Notes (Signed)
ED TO INPATIENT HANDOFF REPORT  ED Nurse Name and Phone #Juli, RN [phone]  S Name/Age/Gender Joshua Reid 63 y.o. male Room/Bed: 044C/044C  Code Status   Code Status: Full Code  Home/SNF/Other Home Patient oriented to: self Is this baseline? Yes   Triage Complete: Triage complete  Chief Complaint chest pain  Triage Note Pt presents with onset of chest pain that woke him from sleep this morning.  Pt reports pain has been constant, is mid-sternal and does not radiate.  +shortness of breath; pt describes as sore.   Allergies No Known Allergies  Level of Care/Admitting Diagnosis ED Disposition    ED Disposition Condition Comment   Admit  Hospital Area: [facility] [[id number]]  Level of Care: Telemetry Cardiac [103]  Diagnosis: Chest pain [100712]  Admitting Physician: Thurmon Fair [[id number]]  Attending Physician: Thurmon Fair [[id number]]  PT Class (Do Not Modify): Observation [104]  PT Acc Code (Do Not Modify): Observation [10022]       B Medical/Surgery History Past Medical History:  Diagnosis Date  . GERD (gastroesophageal reflux disease)   . Hypercholesteremia   . Tobacco abuse    Past Surgical History:  Procedure Laterality Date  . SHOULDER ARTHROSCOPY       A IV Location/Drains/Wounds Patient Lines/Drains/Airways Status   Active Line/Drains/Airways    Name:   Placement date:   Placement time:   Site:   Days:   Peripheral IV 07/11/18 Left Forearm   07/11/18    1129    Forearm   less than 1          Intake/Output Last 24 hours No intake or output data in the 24 hours ending 07/11/18 1401  Labs/Imaging Results for orders placed or performed during the hospital encounter of 07/11/18 (from the past 48 hour(s))  Basic metabolic panel     Status: Abnormal   Collection Time: 07/11/18 11:32 AM  Result Value Ref Range   Sodium 140 135 - 145 mmol/L   Potassium 3.7 3.5 - 5.1 mmol/L   Chloride 103 98 - 111 mmol/L   CO2 26 22 - 32 mmol/L   Glucose, Bld 98 70 - 99 mg/dL   BUN 7 (L) 8 - 23 mg/dL   Creatinine, Ser 1.97 0.61 - 1.24 mg/dL   Calcium 9.0 8.9 - 58.8 mg/dL   GFR calc non Af Amer >60 >60 mL/min   GFR calc Af Amer >60 >60 mL/min   Anion gap 11 5 - 15    Comment: Performed at [facility], [address] [place], [place] [address]  CBC     Status: Abnormal   Collection Time: 07/11/18 11:32 AM  Result Value Ref Range   WBC 12.7 (H) 4.0 - 10.5 K/uL   RBC 4.55 4.22 - 5.81 MIL/uL   Hemoglobin 13.9 13.0 - 17.0 g/dL   HCT 82.6 41.5 - 83.0 %   MCV 92.3 80.0 - 100.0 fL   MCH 30.5 26.0 - 34.0 pg   MCHC 33.1 30.0 - 36.0 g/dL   RDW 94.0 76.8 - 08.8 %   Platelets 319 150 - 400 K/uL   nRBC 0.0 0.0 - 0.2 %    Comment: Performed at [facility], [address] [place], [place] [address]  I-stat troponin, ED     Status: Abnormal   Collection Time: 07/11/18 11:37 AM  Result Value Ref Range   Troponin i, poc 0.27 (HH) 0.00 - 0.08 ng/mL   Comment NOTIFIED PHYSICIAN  Comment 3            Comment: Due to the release kinetics of cTnI, a negative result within the first hours of the onset of symptoms does not rule out myocardial infarction with certainty. If myocardial infarction is still suspected, repeat the test at appropriate intervals.    No results found.  Pending Labs Unresulted Labs (From admission, onward)    Start     Ordered   07/12/18 0500  Lipid panel  Tomorrow morning,   R     07/11/18 1339   07/12/18 0500  Basic metabolic panel  Daily,   R     07/11/18 1339   07/12/18 0500  Heparin level (unfractionated)  Daily,   R     07/11/18 1355   07/12/18 0500  CBC  Daily,   R     07/11/18 1355   07/11/18 2100  Heparin level (unfractionated)  Once-Timed,   R     07/11/18 1355   07/11/18 1336  HIV antibody (Routine Testing)  Once,   R     07/11/18 1339   07/11/18 1331  Troponin I - Now Then Q6H  Now then every 6 hours,   R     07/11/18 1330   07/11/18 1206  Rapid urine drug screen (hospital  performed)  ONCE - STAT,   R     07/11/18 1205          Vitals/Pain Today's Vitals   07/11/18 1127 07/11/18 1231 07/11/18 1313 07/11/18 1318  BP:  (!) 141/82 (!) 155/81 108/81  Pulse:  68 72 90  Resp:  13 14 13   Temp:      TempSrc:      SpO2:  98% 98% 97%  Weight: 81.6 kg     Height: 5\' 9"  (1.753 m)     PainSc:  5  6  3      Isolation Precautions No active isolations  Medications Medications  sodium chloride flush (NS) 0.9 % injection 3 mL (3 mLs Intravenous Not Given 07/11/18 1227)  heparin ADULT infusion 100 units/mL (25000 units/211mL sodium chloride 0.45%) (1,000 Units/hr Intravenous New Bag/Given 07/11/18 1227)  nitroGLYCERIN (NITROSTAT) SL tablet 0.4 mg (0.4 mg Sublingual Given 07/11/18 1314)  famotidine (PEPCID) tablet 20 mg (has no administration in time range)  aspirin EC tablet 81 mg (has no administration in time range)  nitroGLYCERIN (NITROSTAT) SL tablet 0.4 mg (has no administration in time range)  acetaminophen (TYLENOL) tablet 650 mg (has no administration in time range)  ondansetron (ZOFRAN) injection 4 mg (has no administration in time range)  atorvastatin (LIPITOR) tablet 80 mg (has no administration in time range)  metoprolol tartrate (LOPRESSOR) tablet 12.5 mg (has no administration in time range)  heparin bolus via infusion 4,000 Units (4,000 Units Intravenous Bolus from Bag 07/11/18 1227)    Mobility walks Low fall risk   Focused Assessments    R Recommendations: See Admitting Provider Note  Report given to:   Additional Notes:

## 2018-07-11 NOTE — Progress Notes (Signed)
[name], [name] 

## 2018-07-12 ENCOUNTER — Encounter (HOSPITAL_COMMUNITY): Payer: Self-pay | Admitting: Cardiovascular Disease

## 2018-07-12 DIAGNOSIS — K219 Gastro-esophageal reflux disease without esophagitis: Secondary | ICD-10-CM | POA: Diagnosis not present

## 2018-07-12 DIAGNOSIS — E785 Hyperlipidemia, unspecified: Secondary | ICD-10-CM | POA: Diagnosis not present

## 2018-07-12 DIAGNOSIS — I25119 Atherosclerotic heart disease of native coronary artery with unspecified angina pectoris: Secondary | ICD-10-CM | POA: Diagnosis not present

## 2018-07-12 DIAGNOSIS — E7800 Pure hypercholesterolemia, unspecified: Secondary | ICD-10-CM

## 2018-07-12 DIAGNOSIS — I214 Non-ST elevation (NSTEMI) myocardial infarction: Secondary | ICD-10-CM | POA: Diagnosis not present

## 2018-07-12 LAB — LIPID PANEL
Cholesterol: 206 mg/dL — ABNORMAL HIGH (ref 0–200)
HDL: 38 mg/dL — AB (ref >40)
LDL Cholesterol: 134 mg/dL — ABNORMAL HIGH (ref 0–99)
Total CHOL/HDL Ratio: 5.4 RATIO
Triglycerides: 170 mg/dL — ABNORMAL HIGH (ref <150)
VLDL: 34 mg/dL (ref 0–40)

## 2018-07-12 LAB — CBC
HCT: 40 % (ref 39.0–52.0)
Hemoglobin: 13.9 g/dL (ref 13.0–17.0)
MCH: 32 pg (ref 26.0–34.0)
MCHC: 34.8 g/dL (ref 30.0–36.0)
MCV: 92 fL (ref 80.0–100.0)
Platelets: 317 10*3/uL (ref 150–400)
RBC: 4.35 MIL/uL (ref 4.22–5.81)
RDW: 14.2 % (ref 11.5–15.5)
WBC: 11.1 10*3/uL — ABNORMAL HIGH (ref 4.0–10.5)
nRBC: 0 % (ref 0.0–0.2)

## 2018-07-12 LAB — BASIC METABOLIC PANEL
Anion gap: 8 (ref 5–15)
BUN: 8 mg/dL (ref 8–23)
CO2: 24 mmol/L (ref 22–32)
Calcium: 8.8 mg/dL — ABNORMAL LOW (ref 8.9–10.3)
Chloride: 107 mmol/L (ref 98–111)
Creatinine, Ser: 0.79 mg/dL (ref 0.61–1.24)
GFR calc Af Amer: >60 mL/min (ref >60)
GLUCOSE: 102 mg/dL — AB (ref 70–99)
Potassium: 3.6 mmol/L (ref 3.5–5.1)
Sodium: 139 mmol/L (ref 135–145)

## 2018-07-12 LAB — RAPID URINE DRUG SCREEN, HOSP PERFORMED
Amphetamines: NOT DETECTED
Barbiturates: NOT DETECTED
Benzodiazepines: POSITIVE — AB
COCAINE: POSITIVE — AB
Opiates: NOT DETECTED
Tetrahydrocannabinol: POSITIVE — AB

## 2018-07-12 LAB — HIV ANTIBODY (ROUTINE TESTING W REFLEX): HIV Screen 4th Generation wRfx: NONREACTIVE

## 2018-07-12 MED ORDER — ANGIOPLASTY BOOK
Freq: Once | Status: AC
  Administered 2018-07-12: 05:00:00

## 2018-07-12 MED ORDER — ASPIRIN 81 MG PO TBEC: 81.0000 mg | DELAYED_RELEASE_TABLET | Freq: Every day | ORAL | Status: AC

## 2018-07-12 MED ORDER — HEART ATTACK BOUNCING BOOK
Freq: Once | Status: AC
  Administered 2018-07-12: 05:00:00

## 2018-07-12 MED ORDER — THE SENSUOUS HEART BOOK
Freq: Once | Status: AC
  Administered 2018-07-12: 05:00:00

## 2018-07-12 MED ORDER — NICOTINE 21 MG/24HR TD PT24: 21.0000 mg | MEDICATED_PATCH | Freq: Every day | TRANSDERMAL | 0 refills | Status: DC

## 2018-07-12 MED ORDER — METOPROLOL TARTRATE 25 MG PO TABS: 12.5000 mg | ORAL_TABLET | Freq: Two times a day (BID) | ORAL | 4 refills | Status: DC

## 2018-07-12 MED ORDER — ATORVASTATIN CALCIUM 80 MG PO TABS: 80.0000 mg | ORAL_TABLET | Freq: Every day | ORAL | 4 refills | Status: AC

## 2018-07-12 MED ORDER — NITROGLYCERIN 0.4 MG SL SUBL: 0.4000 mg | SUBLINGUAL_TABLET | SUBLINGUAL | 0 refills | Status: DC | PRN

## 2018-07-12 MED ORDER — TICAGRELOR 90 MG PO TABS: 90.0000 mg | ORAL_TABLET | Freq: Two times a day (BID) | ORAL | 4 refills | Status: DC

## 2018-07-12 MED FILL — Heparin Sod (Porcine)-NaCl IV Soln 1000 Unit/500ML-0.9%: INTRAVENOUS | Qty: 500 | Status: AC

## 2018-07-12 NOTE — Progress Notes (Addendum)
Discharge instructions reviewed with pt.  Copy of instructions given to pt, his scripts were sent in electronically by MD to pt's pharmacy, pt informed of this and the need for OTC EC ASA 81mg .   Pt able to teach back his new meds, how to care for his wrist/radial cath site and when to call 911 for chest pain after taking nitroglycerin.             Pt waiting for his family to arrive for transport home.

## 2018-07-12 NOTE — Discharge Summary (Signed)
Discharge Summary    Patient ID: Joshua Reid MRN: [id number]; DOB: 05-13-1955  Admit date: 07/11/2018 Discharge date: 07/12/2018  Primary Care Provider: System, Pcp Not In  Primary Cardiologist: Thurmon Fair, MD   Discharge Diagnoses    Active Problems:   Chest pain   Hyperlipidemia   Tobacco abuse   NSTEMI (non-ST elevated myocardial infarction) [facility])  Allergies No Known Allergies  Diagnostic Studies/Procedures    Cardiac catheterization 07/11/2018:   Prox RCA lesion is 50% stenosed.  Mid RCA lesion is 40% stenosed.  Mid Cx lesion is 99% stenosed.  A drug-eluting stent was successfully placed using a STENT RESOLUTE ONYX [id number].  Post intervention, there is a 0% residual stenosis.  The left ventricular systolic function is normal.  LV end diastolic pressure is normal.  The left ventricular ejection fraction is 55-65% by visual estimate.  There is no mitral valve regurgitation.  1. Severe stenosis mid Circumflex 2. Moderate stenosis proximal and mid RCA 3. Normal LV systolic function 4. Successful PTCA/DES x 1 mid Circumflex.   Recommendations: DAPT with ASA and Brilinta for one year. High dose statin and beta blocker.   History of Present Illness     Joshua Reid has a history of hyperlipidemia, GERD, smoking and polysubstance abuse. He denied any prior cardiac history. He was skeptical about being in the hospital. He reported that he is on disability for shoulder and back problems. 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  Usually he has no exertional chest discomfort or shortness of breath with activity.  On the morning of presentation, he awoke with substernal chest pressure/aching with mild shortness of breath. He noted that he was also a little sweaty and felt a lump in his throat. He called 911 and his pain was resolved en route to the hospital with aspirin and 2 sublingual nitroglycerin.   He had not had any further chest pain until cardiology examination when his symptoms returned to a milder degree. He reported  tenderness of the chest wall with palpitation.  He denied any injury to his chest or falls. He had no palpitations, orthopnea, PND, syncope, dizziness, swelling or leg pain with walking.   He reported that he currently smokes about 3 to 4 cigarettes/week, only when he is drinking. He used to smoke a pack per day until about 2 to 3 years ago. He drinks at least 1-2 beers per day and denies any other recent substance use. His parents are both deceased. He thinks that both of them had a stroke but no heart issues. He has 2 brothers and 4 sisters for which he knows none of their health information.  He is sure that none of them have had a heart attack.  In the ED, troponin was found to be elevated at 0.41 and therefore a cath was recommended.   Hospital Course     He underwent a cath on 07/11/2018 which showed a 99% mLCX lesion in which a DES/PCI was performed. There was moderate stenosis in the p-mPCA and a normal systolic function. Recommendations are for DAPT with ASA and Brilinta for 1 year as well as high dose stain and BB.    Normal left ventricular systolic function no clinical evidence of heart failure or arrhythmia. Discussed mandatory use of dual antiplatelet therapy for the next 12 months. High-dose atorvastatin for 12 months then reevaluate lipid profile. Beta-blocker prescribed. Strongly recommended complete and immediate smoking cessation. Due to  the current healthcare environment, we will schedule for his first follow-up visit in approximately 3 months.  Other hospital concerns: -HLD: LDL elevated at 134 this admission  Placed on high intensity statin and will need follow up lab in 4-[redacted] weeks along with LFTS  -Ongoing tobacco use: Cessation strongly encouraged. Pt ambulated with cardiac rehabilitation without complication.   Consultants: None  The patient  was seen and examined by Dr. Royann Shivers who feel that the patient is stable and ready for discharge today, 07/12/18. Given the current pandemic with COVID-19, we will follow up with him in several months unless there is complication. Our office will reach out to the patient closer to time to schedule this appointment.  Discharge Vitals Blood pressure (!) 148/71, pulse 65, temperature 98 F (36.7 C), temperature source Oral, resp. rate 20, height 5\' 9"  (1.753 m), weight 79.7 kg, SpO2 98 %.  Filed Weights   07/11/18 1127 07/11/18 1453  Weight: 81.6 kg 79.7 kg   Labs & Radiologic Studies    CBC Recent Labs    07/11/18 1132 07/12/18 0346  WBC 12.7* 11.1*  HGB 13.9 13.9  HCT 42.0 40.0  MCV 92.3 92.0  PLT 319 317   Basic Metabolic Panel Recent Labs    97/02/63 1132 07/12/18 0346  NA 140 139  K 3.7 3.6  CL 103 107  CO2 26 24  GLUCOSE 98 102*  BUN 7* 8  CREATININE 0.96 0.79  CALCIUM 9.0 8.8*   Cardiac Enzymes Recent Labs    07/11/18 1354  TROPONINI 0.41*   Fasting Lipid Panel Recent Labs    07/12/18 0346  CHOL 206*  HDL 38*  LDLCALC 134*  TRIG 170*  CHOLHDL 5.4   ____________  Dg Chest 2 View  Result Date: 07/11/2018 CLINICAL DATA:  Pain. EXAM: CHEST - 2 VIEW COMPARISON:  No prior. FINDINGS: Mediastinum and hilar structures normal. Low lung volumes with mild bibasilar atelectasis. No pleural effusion pneumothorax. Heart size normal. Degenerative changes and scoliosis thoracic spine. Mild lower thoracic vertebral body compression fractures, age undetermined. IMPRESSION: 1.  Low lung volumes with mild bibasilar atelectasis. 2. Mild lower thoracic vertebral body compression fractures, age undetermined. Electronically Signed   By: Maisie Fus  Register   On: 07/11/2018 11:52   Disposition   Pt is being discharged home today in good condition.  Follow-up Plans & Appointments    Follow-up Information    Croitoru, Mihai, MD Follow up.   Specialty:  Cardiology Why:  Office  will call with appoitment for approximately 3 months  Contact information: [address] [place] [address] [phone]          Discharge Instructions    Amb Referral to Cardiac Rehabilitation   Complete by:  As directed    Diagnosis:   Coronary Stents NSTEMI     Call MD for:   Complete by:  As directed    Call MD for:  difficulty breathing, headache or visual disturbances   Complete by:  As directed    Call MD for:  extreme fatigue   Complete by:  As directed    Call MD for:  hives   Complete by:  As directed    Call MD for:  persistant dizziness or light-headedness   Complete by:  As directed    Call MD for:  persistant nausea and vomiting   Complete by:  As directed    Call MD for:  redness, tenderness, or signs of infection (pain, swelling, redness, odor  or green/yellow discharge around incision site)   Complete by:  As directed    Call MD for:  severe uncontrolled pain   Complete by:  As directed    Call MD for:  temperature >100.4   Complete by:  As directed    Diet - low sodium heart healthy   Complete by:  As directed    Discharge instructions   Complete by:  As directed    No driving for 3 days. No lifting over 5 lbs for 1 week. No sexual activity for 1 week. Keep procedure site clean & dry. If you notice increased pain, swelling, bleeding or pus, call/return!  You may shower, but no soaking baths/hot tubs/pools for 1 week.   PLEASE DO NOT MISS ANY DOSES OF YOUR BRILINTA!!!!! Also keep a log of you blood pressures and bring back to your follow up appt. Please call the office with any questions.   Patients taking blood thinners should generally stay away from medicines like ibuprofen, Advil, Motrin, naproxen, and Aleve due to risk of stomach bleeding. You may take Tylenol as directed or talk to your primary doctor about alternatives.  Some studies suggest Prilosec/Omeprazole interacts with Plavix. If you have reflux symptoms, please use  Protonix for less chance of interaction.   Increase activity slowly   Complete by:  As directed       Discharge Medications   Allergies as of 07/12/2018   No Known Allergies     Medication List    STOP taking these medications   pseudoephedrine 120 MG 12 hr tablet Commonly known as:  Sudafed 12 Hour     TAKE these medications   aspirin 81 MG EC tablet Take 1 tablet (81 mg total) by mouth daily.   atorvastatin 80 MG tablet Commonly known as:  LIPITOR Take 1 tablet (80 mg total) by mouth daily.   methocarbamol 500 MG tablet Commonly known as:  ROBAXIN Take 1 tablet (500 mg total) by mouth 2 (two) times daily.   metoprolol tartrate 25 MG tablet Commonly known as:  LOPRESSOR Take 0.5 tablets (12.5 mg total) by mouth 2 (two) times daily.   nicotine 21 mg/24hr patch Commonly known as:  NICODERM CQ - dosed in mg/24 hours Place 1 patch (21 mg total) onto the skin daily.   nitroGLYCERIN 0.4 MG SL tablet Commonly known as:  NITROSTAT Place 1 tablet (0.4 mg total) under the tongue every 5 (five) minutes as needed for chest pain.   oxyCODONE-acetaminophen 5-325 MG tablet Commonly known as:  PERCOCET/ROXICET Take 1-2 tablets by mouth every 4 (four) hours as needed for severe pain.   ranitidine 150 MG tablet Commonly known as:  ZANTAC Take 150 mg by mouth every morning.   ticagrelor 90 MG Tabs tablet Commonly known as:  BRILINTA Take 1 tablet (90 mg total) by mouth every 12 (twelve) hours.        Acute coronary syndrome (MI, NSTEMI, STEMI, etc) this admission?: Yes.     AHA/ACC Clinical Performance & Quality Measures: 1. Aspirin prescribed? - Yes 2. ADP Receptor Inhibitor (Plavix/Clopidogrel, Brilinta/Ticagrelor or Effient/Prasugrel) prescribed (includes medically managed patients)? - Yes 3. Beta Blocker prescribed? - Yes 4. High Intensity Statin (Lipitor 40-80mg  or Crestor 20-40mg ) prescribed? - Yes 5. EF assessed during THIS hospitalization? - Yes 6. For EF <40%,  was ACEI/ARB prescribed? - Not Applicable (EF >/= 40%) 7. For EF <40%, Aldosterone Antagonist (Spironolactone or Eplerenone) prescribed? - Not Applicable (EF >/= 40%) 8. Cardiac Rehab Phase II  ordered (Included Medically managed Patients)? - Yes   Outstanding Labs/Studies   Follow up Lipid and LFTs  Duration of Discharge Encounter   Greater than 30 minutes including physician time.  Signed, Georgie Chard, NP 07/12/2018, 10:19 AM

## 2018-07-12 NOTE — Progress Notes (Signed)
TR BAND REMOVAL  LOCATION:    right radial  DEFLATED PER PROTOCOL:    Yes.    TIME BAND OFF / DRESSING APPLIED:    2030   SITE UPON ARRIVAL:    Level 0  SITE AFTER BAND REMOVAL:    Level 0  CIRCULATION SENSATION AND MOVEMENT:    Within Normal Limits   Yes.    COMMENTS:   PT.tolerated procedure well

## 2018-07-12 NOTE — Care Management Obs Status (Signed)
MEDICARE OBSERVATION STATUS NOTIFICATION   Patient Details  Name: Hyun Friese MRN: [id number] Date of Birth: [birth year]   Medicare Observation Status Notification Given:  Yes    Bess Kinds, RN 07/12/2018, 12:38 PM

## 2018-07-12 NOTE — Progress Notes (Addendum)
Progress Note  Patient Name: Joshua Reid Date of Encounter: 07/12/2018  Primary Cardiologist: Thurmon Fair, MD   Subjective   Asymptomatic.  Walked almost 500 feet with cardiac rehab without complaints.  No complications right radial cath access site.  Highest recorded troponin level 0.4.  Normal left ventricular systolic function and filling pressures by cardiac catheterization.  LDL cholesterol 134.  Inpatient Medications    Scheduled Meds: . aspirin EC  81 mg Oral Daily  . atorvastatin  80 mg Oral q1800  . famotidine  20 mg Oral Daily  . metoprolol tartrate  12.5 mg Oral BID  . nicotine  21 mg Transdermal Daily  . sodium chloride flush  3 mL Intravenous Once  . sodium chloride flush  3 mL Intravenous Q12H  . ticagrelor  90 mg Oral Q12H   Continuous Infusions: . sodium chloride     PRN Meds: sodium chloride, acetaminophen, nitroGLYCERIN, nitroGLYCERIN, ondansetron (ZOFRAN) IV, sodium chloride flush   Vital Signs    Vitals:   07/12/18 0105 07/12/18 0205 07/12/18 0304 07/12/18 0820  BP: (!) 142/82 120/67 (!) 148/71   Pulse: 67 81 65 65  Resp:   16 20  Temp:   98.4 F (36.9 C) 98 F (36.7 C)  TempSrc:   Oral Oral  SpO2: 97% 92% 95% 98%  Weight:      Height:        Intake/Output Summary (Last 24 hours) at 07/12/2018 0925 Last data filed at 07/12/2018 0100 Gross per 24 hour  Intake 396.85 ml  Output 500 ml  Net -103.15 ml   Last 3 Weights 07/11/2018 07/11/2018 03/17/2014  Weight (lbs) 175 lb 11.2 oz 180 lb 174 lb  Weight (kg) 79.697 kg 81.647 kg 78.926 kg      Telemetry    Sinus rhythm- Personally Reviewed  ECG    Sinus rhythm, mildly prolonged QTC 409 ms, no other repolarization abnormalities- Personally Reviewed  Physical Exam  Appears well, lying comfortably in bed GEN: No acute distress.   Neck: No JVD Cardiac: RRR, no murmurs, rubs, or gallops.  Respiratory: Clear to auscultation bilaterally. GI: Soft, nontender, non-distended  MS: No edema;  No deformity. Neuro:  Nonfocal  Psych: Normal affect  No evidence of hematoma or ecchymosis at right radial access site  Labs    Chemistry Recent Labs  Lab 07/11/18 1132 07/12/18 0346  NA 140 139  K 3.7 3.6  CL 103 107  CO2 26 24  GLUCOSE 98 102*  BUN 7* 8  CREATININE 0.96 0.79  CALCIUM 9.0 8.8*  GFRNONAA >60 >60  GFRAA >60 >60  ANIONGAP 11 8     Hematology Recent Labs  Lab 07/11/18 1132 07/12/18 0346  WBC 12.7* 11.1*  RBC 4.55 4.35  HGB 13.9 13.9  HCT 42.0 40.0  MCV 92.3 92.0  MCH 30.5 32.0  MCHC 33.1 34.8  RDW 14.1 14.2  PLT 319 317    Cardiac Enzymes Recent Labs  Lab 07/11/18 1354  TROPONINI 0.41*    Recent Labs  Lab 07/11/18 1137  TROPIPOC 0.27*     BNPNo results for input(s): BNP, PROBNP in the last 168 hours.   DDimer No results for input(s): DDIMER in the last 168 hours.   Radiology    Dg Chest 2 View  Result Date: 07/11/2018 CLINICAL DATA:  Pain. EXAM: CHEST - 2 VIEW COMPARISON:  No prior. FINDINGS: Mediastinum and hilar structures normal. Low lung volumes with mild bibasilar atelectasis. No pleural effusion pneumothorax. Heart size  normal. Degenerative changes and scoliosis thoracic spine. Mild lower thoracic vertebral body compression fractures, age undetermined. IMPRESSION: 1.  Low lung volumes with mild bibasilar atelectasis. 2. Mild lower thoracic vertebral body compression fractures, age undetermined. Electronically Signed   By: Maisie Fus  Register   On: 07/11/2018 11:52    Cardiac Studies   Cardiac catheterization 07/11/2018  Prox RCA lesion is 50% stenosed.  Mid RCA lesion is 40% stenosed.  Mid Cx lesion is 99% stenosed.  A drug-eluting stent was successfully placed using a STENT RESOLUTE ONYX [id number].  Post intervention, there is a 0% residual stenosis.  The left ventricular systolic function is normal.  LV end diastolic pressure is normal.  The left ventricular ejection fraction is 55-65% by visual estimate.  There is  no mitral valve regurgitation.   1. Severe stenosis mid Circumflex 2. Moderate stenosis proximal and mid RCA 3. Normal LV systolic function 4. Successful PTCA/DES x 1 mid Circumflex.   Recommendations: DAPT with ASA and Brilinta for one year. High dose statin and beta blocker.    Patient Profile     63 y.o. male with small non-ST segment elevation myocardial infarction due to high-grade stenosis in the left circumflex coronary artery, with moderate disease in the LAD and RCA, moderate hyperlipidemia, mild hypertension, active smoker  Assessment & Plan    Discharge home today. Normal left ventricular systolic function no clinical evidence of heart failure or arrhythmia. Discussed mandatory use of dual antiplatelet therapy for the next 12 months. High-dose atorvastatin for 12 months then reevaluate lipid profile. Beta-blocker prescribed. Strongly recommended complete and immediate smoking cessation. Due to the current healthcare environment, we will schedule for his first follow-up visit in approximately 3 months.   For questions or updates, please contact [facility] Please consult [email or web address] for contact info under        Signed, Thurmon Fair, MD  07/12/2018, 9:25 AM

## 2018-07-12 NOTE — Progress Notes (Signed)
CARDIAC REHAB PHASE I   PRE:  Rate/Rhythm: 69 SR  BP:  Sitting: 132/103        SaO2: 96 RA  MODE:  Ambulation: 470 ft   POST:  Rate/Rhythm: 85 SR  BP:  Sitting: 132/106        SaO2: 97 RA  0735 - 0840  Pt ambulated independently for 470 ft. Tolerated well, no complaints. Gait was strong and quick. Ed complete on MI booklet, stent card, medications (Brilinta & ASA), NTG usage, restrictions, infection prevention, exercise, diet (HH), and smoking cessation. Pt referred to CRPII at [facility]. Pt voices understanding.   Alisia Ferrari, MS 07/12/2018 8:30 AM

## 2018-07-17 ENCOUNTER — Telehealth (HOSPITAL_COMMUNITY): Payer: Self-pay

## 2018-07-17 NOTE — Telephone Encounter (Signed)
Called and spoke with pt in regards to CR, adv pt we have recv'd his referral and our department is closed due to the COVID-19 for the next 4 weeks. And we will contact him once we resume scheduling.   Went over insurance, patient verbalized understanding.

## 2018-07-17 NOTE — Telephone Encounter (Signed)
Pt insurance is active and benefits verified through [facility] Co-pay $0.00, DED $198.00/$0.00 met, out of pocket $6,700.00/$0.00 met, co-insurance 0%. No pre-authorization required. Passport, 07/17/2018 @10 :21AM, REF# [id number]  2ndary insurance is active and benefits verified through Medicaid.Marland Kitchen Co-pay $0.00, DED $0.00/$0.00 met, out of pocket $0.00/$0.00 met, co-insurance 0%. No pre-authorization required. Passport, 07/17/2018 @ 10:26AM, REF# [id number]  Will contact patient to see if he is interested in the Cardiac Rehab Program. If interested, patient will need to complete follow up appt. Once completed, patient will be contacted for scheduling upon review by the RN Navigator.

## 2018-07-25 ENCOUNTER — Encounter: Payer: Self-pay | Admitting: Cardiovascular Disease

## 2018-08-08 ENCOUNTER — Other Ambulatory Visit: Payer: Self-pay | Admitting: Cardiology

## 2018-08-08 NOTE — Telephone Encounter (Signed)
Pt has never been seen in our office. Pt has an upcoming appt with Dr. Royann Shivers. Please address

## 2018-08-08 NOTE — Telephone Encounter (Signed)
Nitrostat 0.4 mg SL refilled.

## 2018-08-22 ENCOUNTER — Telehealth (HOSPITAL_COMMUNITY): Payer: Self-pay | Admitting: *Deleted

## 2018-08-22 NOTE — Telephone Encounter (Signed)
Called and spoke to pt regarding Cardiac Rehab continued closure due to adherence of national recommendation for Covid 19 in group setting.  Pt verbalized understanding. Pt is currently with his daughter in [place].  Pt inquired regarding his follow up appt.  Reviewed this information with him.  Pt desired a face to face appt and understood that his appt would be scheduled out.  Pt would not be able to complete virtual visit.  Pt has flip phone with no voicemail set up, no email or Internet service. Suggested telephone visit.  Pt stated, "I want someone to see me."  Suggested pt contact his cardiologist office - Dr. Royann Shivers in mid May to see if they are scheduling face to face and could his July follow up appt be moved up.  Pt unable to write this information down. Will send pt letter with all of the information he would need for contacting his MD. Talked with pt about exercise.  Pt is walking about a mile every other day.  Pt with no complaints during his walk.  Pt daughter has him on a "tight" heart healthy diet.  Pt continues with tobacoo cessation. Pt congratulated for his effort!!   Advised pt that once he had completed his follow up app and we are permitted to schedule, will contact him. Pt plans to move back closer to [place]. Alanson Aly, BSN Cardiac and Emergency planning/management officer

## 2018-10-24 ENCOUNTER — Telehealth: Payer: Self-pay

## 2018-10-24 NOTE — Telephone Encounter (Signed)
   COVID-19 Pre-Screening Questions:   Do you currently have a fever? NO (yes = cancel and refer to pcp for e-visit)  Have you recently travelled on a cruise, internationally, or to [place], [place], [place], [place], [place], or [place], [place] [employer]) ? NO  Have you been in contact with someone that is currently pending confirmation of Covid19 testing or has been confirmed to have the [place] virus?  NO  Have you been around anyone who has been exposed to Covid 19, or has mentioned symptoms of Covid 19 within the past 7 to 10 days? NO  Are you currently experiencing fatigue or cough? NO  In the past 7 to 10 days have you had a cough,  shortness of breath, headache, congestion, fever (100 or greater) body aches, chills, sore throat, or sudden loss of taste or sense of smell? NO  Patient reminded of appointment on 10/29/18 at 11:30a. Reiterated no additional visitors. Arrive no earlier than 15 minutes before appointment time. Please bring own mask.  Patient verbalized understanding and agreed with plan.

## 2018-10-29 ENCOUNTER — Ambulatory Visit (INDEPENDENT_AMBULATORY_CARE_PROVIDER_SITE_OTHER): Payer: Medicare PPO | Admitting: Cardiovascular Disease

## 2018-10-29 ENCOUNTER — Other Ambulatory Visit: Payer: Self-pay

## 2018-10-29 ENCOUNTER — Encounter: Payer: Self-pay | Admitting: Cardiovascular Disease

## 2018-10-29 VITALS — BP 135/77 | HR 76 | Temp 97.5°F | Ht 68.0 in | Wt 173.6 lb

## 2018-10-29 DIAGNOSIS — I2510 Atherosclerotic heart disease of native coronary artery without angina pectoris: Secondary | ICD-10-CM | POA: Diagnosis not present

## 2018-10-29 DIAGNOSIS — Z720 Tobacco use: Secondary | ICD-10-CM

## 2018-10-29 DIAGNOSIS — E782 Mixed hyperlipidemia: Secondary | ICD-10-CM | POA: Diagnosis not present

## 2018-10-29 NOTE — Patient Instructions (Addendum)
Medication Instructions:  STOP the Metoprolol  If you need a refill on your cardiac medications before your next appointment, please call your pharmacy.   Lab work: Your provider would like for you to have the following labs today: Lipid and CMET  If you have labs (blood work) drawn today and your tests are completely normal, you will receive your results only by: [place] (if you have MyChart) OR A paper copy in the mail If you have any lab test that is abnormal or we need to change your treatment, we will call you to review the results.  Testing/Procedures: None ordered  Follow-Up: At [facility], you and your health needs are our priority.  As part of our continuing mission to provide you with exceptional heart care, we have created designated Provider Care Teams.  These Care Teams include your primary Cardiologist (physician) and Advanced Practice Providers (APPs -  Physician Assistants and Nurse Practitioners) who all work together to provide you with the care you need, when you need it. You will need a follow up appointment in 8 months.  Please call our office 2 months in advance to schedule this appointment.  You may see Sanda Klein, MD or one of the following Advanced Practice Providers on your designated Care Team: Almyra Deforest, PA-C Fabian Sharp, [place]

## 2018-10-29 NOTE — Progress Notes (Signed)
Cardiology Office Note:    Date:  10/29/2018   ID:  Joshua BouchardJames Reid, DOB 1956/04/12, MRN [id number]  PCP:  System, Pcp Not In  Cardiologist:  Thurmon FairMihai Breeonna Mone, MD  Electrophysiologist:  None   Referring MD: No ref. provider found   Chief Complaint  Patient presents with   Coronary Artery Disease  s/p acute NSTEMi and DES-LCX July 11, 2018  History of Present Illness:    Joshua BouchardJames Reid is a 63 y.o. male with recent diagnosis of CAD, presenting with with a small NSTEMI in March 2020, treated with placement of a drug-eluting stent (RESOLUTE ONYX 2.75X18) to the left circumflex coronary artery, also found to have 40-50% stenoses in the proximal-mid right coronary artery, but with preserved left ventricular systolic function and normal filling pressures.He has not had any further problems with angina in the ensuing 3 months, but has noticed a little bit of exertional dyspnea.  He has rare palpitations but denies dizziness or syncope.  He has not had leg edema or claudication and denies focal neurological complaints, falls, injuries or bleeding.  He is compliant with aspirin and Brilinta.    He smoked a pack of cigarettes daily for about 40 years, but now is smoking at most 1 cigarette a day.  He is trying to quit altogether.  He continues to walk for exercise.  He does not have any symptoms of novel coronavirus infection and to his knowledge has not been exposed to anybody caring the disease.  Past Medical History:  Diagnosis Date   GERD (gastroesophageal reflux disease)    Hypercholesteremia    Tobacco abuse     Past Surgical History:  Procedure Laterality Date   CORONARY STENT INTERVENTION N/A 07/11/2018   Procedure: CORONARY STENT INTERVENTION;  Surgeon: Kathleene HazelMcAlhany, Christopher D, MD;  [facility];  Service: Cardiovascular;  Laterality: N/A;   LEFT HEART CATH AND CORONARY ANGIOGRAPHY N/A 07/11/2018   Procedure: LEFT HEART CATH AND CORONARY ANGIOGRAPHY;  Surgeon: Kathleene HazelMcAlhany,  Christopher D, MD;  Location: [facility];  Service: Cardiovascular;  Laterality: N/A;   SHOULDER ARTHROSCOPY      Current Medications: Current Meds  Medication Sig   aspirin EC 81 MG EC tablet Take 1 tablet (81 mg total) by mouth daily.   atorvastatin (LIPITOR) 80 MG tablet Take 1 tablet (80 mg total) by mouth daily.   nitroGLYCERIN (NITROSTAT) 0.4 MG SL tablet ONE TABLET UNDER TONGUE AS NEEDED FOR CHEST PAIN EVERY 5 MINS   ranitidine (ZANTAC) 150 MG tablet Take 150 mg by mouth every morning.   ticagrelor (BRILINTA) 90 MG TABS tablet Take 1 tablet (90 mg total) by mouth every 12 (twelve) hours.   [DISCONTINUED] methocarbamol (ROBAXIN) 500 MG tablet Take 1 tablet (500 mg total) by mouth 2 (two) times daily.   [DISCONTINUED] metoprolol tartrate (LOPRESSOR) 25 MG tablet Take 0.5 tablets (12.5 mg total) by mouth 2 (two) times daily.   [DISCONTINUED] nicotine (NICODERM CQ - DOSED IN MG/24 HOURS) 21 mg/24hr patch Place 1 patch (21 mg total) onto the skin daily.   [DISCONTINUED] oxyCODONE-acetaminophen (PERCOCET/ROXICET) 5-325 MG per tablet Take 1-2 tablets by mouth every 4 (four) hours as needed for severe pain.     Allergies:   Patient has no known allergies.   Social History   Socioeconomic History   Marital status: Single    Spouse name: Not on file   Number of children: Not on file   Years of education: Not on file   Highest education  level: Not on file  Occupational History   Not on file  Social Needs   Financial resource strain: Not on file   Food insecurity    Worry: Not on file    Inability: Not on file   Transportation needs    Medical: Not on file    Non-medical: Not on file  Tobacco Use   Smoking status: Current Every Day Smoker    Packs/day: 0.50    Types: Cigarettes   Smokeless tobacco: Never Used  Substance and Sexual Activity   Alcohol use: Yes    Comment: pt states "as much as I can"   Drug use: Yes    Types: Cocaine   Sexual  activity: Not on file  Lifestyle   Physical activity    Days per week: Not on file    Minutes per session: Not on file   Stress: Not on file  Relationships   Social connections    Talks on phone: Not on file    Gets together: Not on file    Attends religious service: Not on file    Active member of club or organization: Not on file    Attends meetings of clubs or organizations: Not on file    Relationship status: Not on file  Other Topics Concern   Not on file  Social History Narrative   Not on file     Family History: The patient's family history includes Diabetes in his mother; Hypertension in his mother; Stroke in his father and mother.  ROS:   Please see the history of present illness.     All other systems reviewed and are negative.  EKGs/Labs/Other Studies Reviewed:    The following studies were reviewed today: Coronary angiography and stent placement with Dr. Angelena Form July 11, 2018   Prox RCA lesion is 50% stenosed.  Mid RCA lesion is 40% stenosed.  Mid Cx lesion is 99% stenosed.  A drug-eluting stent was successfully placed using a [place] [id number].  Post intervention, there is a 0% residual stenosis.  The left ventricular systolic function is normal.  LV end diastolic pressure is normal.  The left ventricular ejection fraction is 55-65% by visual estimate.  There is no mitral valve regurgitation.   1. Severe stenosis mid Circumflex 2. Moderate stenosis proximal and mid RCA 3. Normal LV systolic function 4. Successful PTCA/DES x 1 mid Circumflex.   Recommendations: DAPT with ASA and Brilinta for one year. High dose statin and beta blocker.   EKG:  EKG is not ordered today.    Recent Labs: 07/12/2018: BUN 8; Creatinine, Ser 0.79; Hemoglobin 13.9; Platelets 317; Potassium 3.6; Sodium 139  Recent Lipid Panel    Component Value Date/Time   CHOL 206 (H) 07/12/2018 0346   TRIG 170 (H) 07/12/2018 0346   HDL 38 (L) 07/12/2018 0346    CHOLHDL 5.4 07/12/2018 0346   VLDL 34 07/12/2018 0346   LDLCALC 134 (H) 07/12/2018 0346    Physical Exam:    VS:  BP 135/77    Pulse 76    Temp (!) 97.5 F (36.4 C)    Ht 5\' 8"  (1.727 m)    Wt 173 lb 9.6 oz (78.7 kg)    SpO2 93%    BMI 26.40 kg/m     Wt Readings from Last 3 Encounters:  10/29/18 173 lb 9.6 oz (78.7 kg)  07/11/18 175 lb 11.2 oz (79.7 kg)  03/17/14 174 lb (78.9 kg)     GEN: Appears  relatively lean and fit, well nourished, well developed in no acute distress HEENT: Normal NECK: No JVD; No carotid bruits LYMPHATICS: No lymphadenopathy CARDIAC: RRR, no murmurs, rubs, gallops RESPIRATORY:  Clear to auscultation without rales, wheezing or rhonchi  ABDOMEN: Soft, non-tender, non-distended MUSCULOSKELETAL:  No edema; No deformity  SKIN: Warm and dry NEUROLOGIC:  Alert and oriented x 3 PSYCHIATRIC:  Normal affect   ASSESSMENT:    1. Coronary artery disease involving native coronary artery of native heart without angina pectoris   2. Mixed hyperlipidemia   3. Tobacco abuse    PLAN:    In order of problems listed above:  1. CAD: Continue dual antiplatelet therapy through March 2021, then stop ticagrelor.  Aspirin 81 mg will be continued indefinitely. 2. HLP: Target LDL cholesterol less than 70, will recheck today.  On high-dose statin for at least 12 months.. 3. Smoking: He had normal left ventricular systolic function.  I wonder if the beta-blocker is contributing to some degree of reactive airway disease/COPD in a patient with well over 30-pack-year history of smoking.  We will stop the metoprolol.  It does not sound like Brilinta side effects, since the symptoms occur only with activity and not at rest.  Also think, after getting to know him a little bit that shortening his list of medications will definitely improve his compliance with truly critical agents, such as the antiplatelet and statin.   Medication Adjustments/Labs and Tests Ordered: Current medicines  are reviewed at length with the patient today.  Concerns regarding medicines are outlined above.  Orders Placed This Encounter  Procedures   Comprehensive metabolic panel   Lipid panel   No orders of the defined types were placed in this encounter.   Patient Instructions  Medication Instructions:  STOP the Metoprolol  If you need a refill on your cardiac medications before your next appointment, please call your pharmacy.   Lab work: Your provider would like for you to have the following labs today: Lipid and CMET  If you have labs (blood work) drawn today and your tests are completely normal, you will receive your results only by: MyChart Message (if you have MyChart) OR A paper copy in the mail If you have any lab test that is abnormal or we need to change your treatment, we will call you to review the results.  Testing/Procedures: None ordered  Follow-Up: At [facility], you and your health needs are our priority.  As part of our continuing mission to provide you with exceptional heart care, we have created designated Provider Care Teams.  These Care Teams include your primary Cardiologist (physician) and Advanced Practice Providers (APPs -  Physician Assistants and Nurse Practitioners) who all work together to provide you with the care you need, when you need it. You will need a follow up appointment in 8 months.  Please call our office 2 months in advance to schedule this appointment.  You may see Thurmon FairMihai Vidal Lampkins, MD or one of the following Advanced Practice Providers on your designated Care Team: Azalee CourseHao Meng, PA-C Micah FlesherAngela Duke, PA-C        Signed, Thurmon FairMihai Taisa Deloria, MD  10/29/2018 12:36 PM    [facility]

## 2018-10-30 ENCOUNTER — Telehealth: Payer: Self-pay | Admitting: *Deleted

## 2018-10-30 LAB — COMPREHENSIVE METABOLIC PANEL
ALT: 25 IU/L (ref 0–44)
AST: 25 IU/L (ref 0–40)
Albumin/Globulin Ratio: 1.9 (ref 1.2–2.2)
Albumin: 4.7 g/dL (ref 3.8–4.8)
Alkaline Phosphatase: 133 IU/L — ABNORMAL HIGH (ref 39–117)
BUN/Creatinine Ratio: 10 (ref 10–24)
BUN: 9 mg/dL (ref 8–27)
Bilirubin Total: 0.5 mg/dL (ref 0.0–1.2)
CO2: 23 mmol/L (ref 20–29)
Calcium: 10 mg/dL (ref 8.6–10.2)
Chloride: 101 mmol/L (ref 96–106)
Creatinine, Ser: 0.92 mg/dL (ref 0.76–1.27)
GFR calc Af Amer: 103 mL/min/{1.73_m2} (ref >59)
GFR calc non Af Amer: 89 mL/min/{1.73_m2} (ref >59)
Globulin, Total: 2.5 g/dL (ref 1.5–4.5)
Glucose: 79 mg/dL (ref 65–99)
Potassium: 4.1 mmol/L (ref 3.5–5.2)
Sodium: 139 mmol/L (ref 134–144)
Total Protein: 7.2 g/dL (ref 6.0–8.5)

## 2018-10-30 LAB — LIPID PANEL
Chol/HDL Ratio: 3.8 ratio (ref 0.0–5.0)
Cholesterol, Total: 179 mg/dL (ref 100–199)
HDL: 47 mg/dL (ref >39)
LDL Calculated: 100 mg/dL — ABNORMAL HIGH (ref 0–99)
Triglycerides: 161 mg/dL — ABNORMAL HIGH (ref 0–149)
VLDL Cholesterol Cal: 32 mg/dL (ref 5–40)

## 2018-10-30 NOTE — Telephone Encounter (Signed)
Left a message for the patient to call back.  

## 2018-10-30 NOTE — Telephone Encounter (Signed)
-----   Message from Sanda Klein, MD sent at 10/30/2018  8:21 AM EDT ----- Routine labs are OK. LDL cholesterol is too high. I wonder if he is taking the atorvastatin daily - I would have expected a much better reduction in cholesterol. Please ask him if he is taking it every day. If the answer is yes, add ezetimibe 10 mg daily #90, RF3. If the answer is no, tell him it is very important to take it as prescribed to avoid new blockages and heart attacks. Either way, repeat a lipid profile and LFTs in 3 months.

## 2018-11-04 ENCOUNTER — Telehealth (HOSPITAL_COMMUNITY): Payer: Self-pay

## 2018-11-05 ENCOUNTER — Telehealth (HOSPITAL_COMMUNITY): Payer: Self-pay | Admitting: Pharmacist

## 2018-11-05 NOTE — Telephone Encounter (Signed)
Cardiac Rehab Medication Review by a Pharmacist   Pharmacist comments: Patient did not want to review his medications or allergies over the phone. He stated that he has a list of his medications that he will bring to his appointment on 7/21.   Richardine Service, PharmD PGY1 Pharmacy Resident Direct Line: [phone] 11/05/2018 11:43 AM

## 2018-11-11 ENCOUNTER — Telehealth (HOSPITAL_COMMUNITY): Payer: Self-pay | Admitting: *Deleted

## 2018-11-11 NOTE — Telephone Encounter (Signed)
Spoke with Mr Hoogendoorn confirmed that he is coming to orientation tomorrow afternoon. Completed health history.Barnet Pall, RN,BSN 11/11/2018 4:01 PM

## 2018-11-12 ENCOUNTER — Telehealth (HOSPITAL_COMMUNITY): Payer: Self-pay | Admitting: *Deleted

## 2018-11-12 ENCOUNTER — Ambulatory Visit (HOSPITAL_COMMUNITY): Payer: Medicare PPO

## 2018-11-13 NOTE — Telephone Encounter (Signed)
Attempted to reach the patient. Unable to leave a message 

## 2018-11-18 ENCOUNTER — Ambulatory Visit (HOSPITAL_COMMUNITY): Payer: Medicare PPO

## 2018-11-20 ENCOUNTER — Ambulatory Visit (HOSPITAL_COMMUNITY): Payer: Medicare PPO

## 2018-11-22 ENCOUNTER — Ambulatory Visit (HOSPITAL_COMMUNITY): Payer: Medicare PPO

## 2018-11-25 ENCOUNTER — Ambulatory Visit (HOSPITAL_COMMUNITY): Payer: Medicare PPO

## 2018-11-27 ENCOUNTER — Ambulatory Visit (HOSPITAL_COMMUNITY): Payer: Medicare PPO

## 2018-11-29 ENCOUNTER — Ambulatory Visit (HOSPITAL_COMMUNITY): Payer: Medicare PPO

## 2018-12-02 ENCOUNTER — Ambulatory Visit (HOSPITAL_COMMUNITY): Payer: Medicare PPO

## 2018-12-03 NOTE — Telephone Encounter (Signed)
Unable to reach the patient. Lab results will be mailed

## 2018-12-04 ENCOUNTER — Other Ambulatory Visit: Payer: Self-pay | Admitting: *Deleted

## 2018-12-04 ENCOUNTER — Ambulatory Visit (HOSPITAL_COMMUNITY): Payer: Medicare PPO

## 2018-12-04 ENCOUNTER — Encounter: Payer: Self-pay | Admitting: *Deleted

## 2018-12-04 DIAGNOSIS — E782 Mixed hyperlipidemia: Secondary | ICD-10-CM

## 2018-12-06 ENCOUNTER — Ambulatory Visit (HOSPITAL_COMMUNITY): Payer: Medicare PPO

## 2018-12-09 ENCOUNTER — Ambulatory Visit (HOSPITAL_COMMUNITY): Payer: Medicare PPO

## 2018-12-11 ENCOUNTER — Ambulatory Visit (HOSPITAL_COMMUNITY): Payer: Medicare PPO

## 2018-12-12 ENCOUNTER — Telehealth: Payer: Self-pay | Admitting: Cardiovascular Disease

## 2018-12-12 DIAGNOSIS — E782 Mixed hyperlipidemia: Secondary | ICD-10-CM

## 2018-12-12 DIAGNOSIS — I2510 Atherosclerotic heart disease of native coronary artery without angina pectoris: Secondary | ICD-10-CM

## 2018-12-12 MED ORDER — EZETIMIBE 10 MG PO TABS: 10.0000 mg | ORAL_TABLET | Freq: Every day | ORAL | 3 refills | Status: DC

## 2018-12-12 NOTE — Telephone Encounter (Signed)
Returned a call from patient .Stated he was told he received a letter to call office about lab work.Stated he is staying in [place] at present.Lab results given.Stated he is taking Atorvastatin 80 mg daily.Advised Dr.Croitoru wants you to continue and add Zetia 10 mg daily.Repeat fasting lipid and hepatic panels in 3 months.

## 2018-12-12 NOTE — Telephone Encounter (Signed)
New Message     Pt is calling and says he is returning a call  He is also wondering about lab work    Please call

## 2018-12-13 ENCOUNTER — Ambulatory Visit (HOSPITAL_COMMUNITY): Payer: Medicare PPO

## 2018-12-16 ENCOUNTER — Ambulatory Visit (HOSPITAL_COMMUNITY): Payer: Medicare PPO

## 2018-12-18 ENCOUNTER — Ambulatory Visit (HOSPITAL_COMMUNITY): Payer: Medicare PPO

## 2018-12-20 ENCOUNTER — Ambulatory Visit (HOSPITAL_COMMUNITY): Payer: Medicare PPO

## 2018-12-23 ENCOUNTER — Ambulatory Visit (HOSPITAL_COMMUNITY): Payer: Medicare PPO

## 2018-12-25 ENCOUNTER — Ambulatory Visit (HOSPITAL_COMMUNITY): Payer: Medicare PPO

## 2018-12-27 ENCOUNTER — Ambulatory Visit (HOSPITAL_COMMUNITY): Payer: Medicare PPO

## 2019-04-11 ENCOUNTER — Encounter: Payer: Self-pay | Admitting: Cardiovascular Disease

## 2019-04-11 ENCOUNTER — Encounter (INDEPENDENT_AMBULATORY_CARE_PROVIDER_SITE_OTHER): Payer: Self-pay

## 2019-04-11 ENCOUNTER — Ambulatory Visit (INDEPENDENT_AMBULATORY_CARE_PROVIDER_SITE_OTHER): Payer: Medicare PPO | Admitting: Cardiovascular Disease

## 2019-04-11 ENCOUNTER — Other Ambulatory Visit: Payer: Self-pay

## 2019-04-11 VITALS — BP 127/81 | HR 78 | Temp 97.9°F | Ht 68.0 in | Wt 186.6 lb

## 2019-04-11 DIAGNOSIS — E7800 Pure hypercholesterolemia, unspecified: Secondary | ICD-10-CM | POA: Diagnosis not present

## 2019-04-11 DIAGNOSIS — I2510 Atherosclerotic heart disease of native coronary artery without angina pectoris: Secondary | ICD-10-CM | POA: Diagnosis not present

## 2019-04-11 DIAGNOSIS — F17200 Nicotine dependence, unspecified, uncomplicated: Secondary | ICD-10-CM

## 2019-04-11 MED ORDER — EZETIMIBE 10 MG PO TABS: 10.0000 mg | ORAL_TABLET | Freq: Every day | ORAL | 3 refills | Status: AC

## 2019-04-11 MED ORDER — TICAGRELOR 90 MG PO TABS: 90.0000 mg | ORAL_TABLET | Freq: Two times a day (BID) | ORAL | 0 refills | Status: DC

## 2019-04-11 NOTE — Patient Instructions (Signed)
Medication Instructions:  STOP the Brilinta in March  *If you need a refill on your cardiac medications before your next appointment, please call your pharmacy*  Lab Work: None ordered If you have labs (blood work) drawn today and your tests are completely normal, you will receive your results only by: Marland Kitchen MyChart Message (if you have MyChart) OR . A paper copy in the mail If you have any lab test that is abnormal or we need to change your treatment, we will call you to review the results.  Testing/Procedures: None ordered  Follow-Up: At [facility], you and your health needs are our priority.  As part of our continuing mission to provide you with exceptional heart care, we have created designated Provider Care Teams.  These Care Teams include your primary Cardiologist (physician) and Advanced Practice Providers (APPs -  Physician Assistants and Nurse Practitioners) who all work together to provide you with the care you need, when you need it.  Your next appointment:   12 month(s)  The format for your next appointment:   In Person  Provider:   Sanda Klein, MD

## 2019-04-11 NOTE — Progress Notes (Signed)
Cardiology Office Note:    Date:  04/11/2019   ID:  Joshua Reid, DOB 01-Aug-1955, MRN [id number]  PCP:  System, Pcp Not In  Cardiologist:  Joshua FairMihai Sharai Overbay, MD  Electrophysiologist:  None   Referring MD: No ref. provider found   Chief Complaint  Patient presents with  . Coronary Artery Disease  s/p acute NSTEMi and DES-LCX July 11, 2018  History of Present Illness:    Joshua BouchardJames Reid is a 63 y.o. male with recent diagnosis of CAD, presenting with with a small NSTEMI in March 2020, treated with placement of a drug-eluting stent (RESOLUTE ONYX 2.75X18) to the left circumflex coronary artery, also found to have 40-50% stenoses in the proximal-mid right coronary artery, but with preserved left ventricular systolic function and normal filling pressures.  The patient specifically denies any chest pain at rest exertion, dyspnea at rest or with exertion, orthopnea, paroxysmal nocturnal dyspnea, syncope, palpitations, focal neurological deficits, intermittent claudication, lower extremity edema, unexplained weight gain, cough, hemoptysis or wheezing.  He smoked a pack of cigarettes daily for about 40 years, but he has cut back substantially.  He will go for days without smoking cigarettes, been with 1 or 2 when he is having a drink.  He does not have any symptoms of novel coronavirus infection and to his knowledge has not been exposed to anybody caring the disease.  Past Medical History:  Diagnosis Date  . GERD (gastroesophageal reflux disease)   . Hypercholesteremia   . Tobacco abuse     Past Surgical History:  Procedure Laterality Date  . CORONARY STENT INTERVENTION N/A 07/11/2018   Procedure: CORONARY STENT INTERVENTION;  Surgeon: Kathleene HazelMcAlhany, Christopher D, MD;  Location: [facility];  Service: Cardiovascular;  Laterality: N/A;  . LEFT HEART CATH AND CORONARY ANGIOGRAPHY N/A 07/11/2018   Procedure: LEFT HEART CATH AND CORONARY ANGIOGRAPHY;  Surgeon: Kathleene HazelMcAlhany, Christopher D, MD;   Location: [facility];  Service: Cardiovascular;  Laterality: N/A;  . SHOULDER ARTHROSCOPY      Current Medications: Current Meds  Medication Sig  . aspirin EC 81 MG EC tablet Take 1 tablet (81 mg total) by mouth daily.  Marland Kitchen. atorvastatin (LIPITOR) 80 MG tablet Take 1 tablet (80 mg total) by mouth daily.  Marland Kitchen. ezetimibe (ZETIA) 10 MG tablet Take 1 tablet (10 mg total) by mouth daily.  . metoprolol tartrate (LOPRESSOR) 25 MG tablet   . nitroGLYCERIN (NITROSTAT) 0.4 MG SL tablet ONE TABLET UNDER TONGUE AS NEEDED FOR CHEST PAIN EVERY 5 MINS  . ranitidine (ZANTAC) 150 MG tablet Take 150 mg by mouth every morning.  . tamsulosin (FLOMAX) 0.4 MG CAPS capsule   . ticagrelor (BRILINTA) 90 MG TABS tablet Take 1 tablet (90 mg total) by mouth every 12 (twelve) hours.  . traMADol (ULTRAM) 50 MG tablet   . [DISCONTINUED] ezetimibe (ZETIA) 10 MG tablet Take 1 tablet (10 mg total) by mouth daily.  . [DISCONTINUED] ticagrelor (BRILINTA) 90 MG TABS tablet Take 1 tablet (90 mg total) by mouth every 12 (twelve) hours.     Allergies:   Patient has no known allergies.   Social History   Socioeconomic History  . Marital status: Single    Spouse name: Not on file  . Number of children: Not on file  . Years of education: Not on file  . Highest education level: Not on file  Occupational History  . Not on file  Tobacco Use  . Smoking status: Current Every Day Smoker    Packs/day: 0.50  Types: Cigarettes  . Smokeless tobacco: Never Used  Substance and Sexual Activity  . Alcohol use: Yes    Comment: pt states "as much as I can"  . Drug use: Yes    Types: Cocaine  . Sexual activity: Not on file  Other Topics Concern  . Not on file  Social History Narrative  . Not on file   Social Determinants of Health   Financial Resource Strain:   . Difficulty of Paying Living Expenses: Not on file  Food Insecurity:   . Worried About Programme researcher, broadcasting/film/video in the Last Year: Not on file  . Ran Out of Food in  the Last Year: Not on file  Transportation Needs:   . Lack of Transportation (Medical): Not on file  . Lack of Transportation (Non-Medical): Not on file  Physical Activity:   . Days of Exercise per Week: Not on file  . Minutes of Exercise per Session: Not on file  Stress:   . Feeling of Stress : Not on file  Social Connections:   . Frequency of Communication with Friends and Family: Not on file  . Frequency of Social Gatherings with Friends and Family: Not on file  . Attends Religious Services: Not on file  . Active Member of Clubs or Organizations: Not on file  . Attends Banker Meetings: Not on file  . Marital Status: Not on file     Family History: The patient's family history includes Diabetes in his mother; Hypertension in his mother; Stroke in his father and mother.  ROS:   Please see the history of present illness.     All other systems reviewed and are negative.  EKGs/Labs/Other Studies Reviewed:    The following studies were reviewed today: Coronary angiography and stent placement with Dr. Clifton Reid July 11, 2018   Prox RCA lesion is 50% stenosed.  Mid RCA lesion is 40% stenosed.  Mid Cx lesion is 99% stenosed.  A drug-eluting stent was successfully placed using a STENT RESOLUTE ONYX [id number].  Post intervention, there is a 0% residual stenosis.  The left ventricular systolic function is normal.  LV end diastolic pressure is normal.  The left ventricular ejection fraction is 55-65% by visual estimate.  There is no mitral valve regurgitation.   1. Severe stenosis mid Circumflex 2. Moderate stenosis proximal and mid RCA 3. Normal LV systolic function 4. Successful PTCA/DES x 1 mid Circumflex.   Recommendations: DAPT with ASA and Brilinta for one year. High dose statin and beta blocker.   EKG:  EKG is not ordered today.    Recent Labs: 07/12/2018: Hemoglobin 13.9; Platelets 317 10/29/2018: ALT 25; BUN 9; Creatinine, Ser 0.92; Potassium 4.1;  Sodium 139  Recent Lipid Panel    Component Value Date/Time   CHOL 179 10/29/2018 1240   TRIG 161 (H) 10/29/2018 1240   HDL 47 10/29/2018 1240   CHOLHDL 3.8 10/29/2018 1240   CHOLHDL 5.4 07/12/2018 0346   VLDL 34 07/12/2018 0346   LDLCALC 100 (H) 10/29/2018 1240    Physical Exam:    VS:  BP 127/81   Pulse 78   Temp 97.9 F (36.6 C)   Ht 5\' 8"  (1.727 m)   Wt 186 lb 9.6 oz (84.6 kg)   SpO2 95%   BMI 28.37 kg/m     Wt Readings from Last 3 Encounters:  04/11/19 186 lb 9.6 oz (84.6 kg)  10/29/18 173 lb 9.6 oz (78.7 kg)  07/11/18 175 lb 11.2 oz (79.7  kg)      General: Alert, oriented x3, no distress, appears lean and fit Head: no evidence of trauma, PERRL, EOMI, no exophtalmos or lid lag, no myxedema, no xanthelasma; normal ears, nose and oropharynx Neck: normal jugular venous pulsations and no hepatojugular reflux; brisk carotid pulses without delay and no carotid bruits Chest: clear to auscultation, no signs of consolidation by percussion or palpation, normal fremitus, symmetrical and full respiratory excursions Cardiovascular: normal position and quality of the apical impulse, regular rhythm, normal first and second heart sounds, no murmurs, rubs or gallops Abdomen: no tenderness or distention, no masses by palpation, no abnormal pulsatility or arterial bruits, normal bowel sounds, no hepatosplenomegaly Extremities: no clubbing, cyanosis or edema; 2+ radial, ulnar and brachial pulses bilaterally; 2+ right femoral, posterior tibial and dorsalis pedis pulses; 2+ left femoral, posterior tibial and dorsalis pedis pulses; no subclavian or femoral bruits Neurological: grossly nonfocal Psych: Normal mood and affect   ASSESSMENT:    1. Coronary artery disease involving native coronary artery of native heart without angina pectoris   2. Hypercholesterolemia   3. Smoking    PLAN:    In order of problems listed above:  1. CAD: Asymptomatic.  Stop Brilinta in March.  Aspirin 81  mg will be continued indefinitely. 2. HLP: Ezetimibe was added to his high-dose atorvastatin and after the labs in July showed an LDL cholesterol of 100.  Offered to recheck today, but he is not fasting.  We will recheck at his next appointment. 3. Smoking: Encouraged him to try to quit smoking permanently, since it is easier to slip back into his habit if he still has cigarettes and smoking paraphernalia around the house.   Medication Adjustments/Labs and Tests Ordered: Current medicines are reviewed at length with the patient today.  Concerns regarding medicines are outlined above.  Orders Placed This Encounter  Procedures  . EKG 12-Lead   Meds ordered this encounter  Medications  . ezetimibe (ZETIA) 10 MG tablet    Sig: Take 1 tablet (10 mg total) by mouth daily.    Dispense:  90 tablet    Refill:  3  . ticagrelor (BRILINTA) 90 MG TABS tablet    Sig: Take 1 tablet (90 mg total) by mouth every 12 (twelve) hours.    Dispense:  180 tablet    Refill:  0    Patient Instructions  Medication Instructions:  STOP the Brilinta in March  *If you need a refill on your cardiac medications before your next appointment, please call your pharmacy*  Lab Work: None ordered If you have labs (blood work) drawn today and your tests are completely normal, you will receive your results only by: Marland Kitchen MyChart Message (if you have MyChart) OR . A paper copy in the mail If you have any lab test that is abnormal or we need to change your treatment, we will call you to review the results.  Testing/Procedures: None ordered  Follow-Up: At [facility], you and your health needs are our priority.  As part of our continuing mission to provide you with exceptional heart care, we have created designated Provider Care Teams.  These Care Teams include your primary Cardiologist (physician) and Advanced Practice Providers (APPs -  Physician Assistants and Nurse Practitioners) who all work together to provide you  with the care you need, when you need it.  Your next appointment:   12 month(s)  The format for your next appointment:   In Person  Provider:   Thurmon Fair,  MD     Signed, Sanda Klein, MD  04/11/2019 10:08 AM    [place]

## 2019-04-14 ENCOUNTER — Other Ambulatory Visit: Payer: Self-pay | Admitting: *Deleted

## 2019-05-13 ENCOUNTER — Encounter: Payer: Self-pay | Admitting: *Deleted

## 2019-05-21 ENCOUNTER — Other Ambulatory Visit: Payer: Self-pay

## 2019-05-21 MED ORDER — NITROGLYCERIN 0.4 MG SL SUBL: SUBLINGUAL_TABLET | SUBLINGUAL | 1 refills | Status: AC

## 2020-07-04 IMAGING — CR CHEST - 2 VIEW
2 series · 2 of 2 positions shown · non-contrast
Comparison: No prior.

CLINICAL DATA: Pain.

EXAM:
CHEST - 2 VIEW

[chest pa]
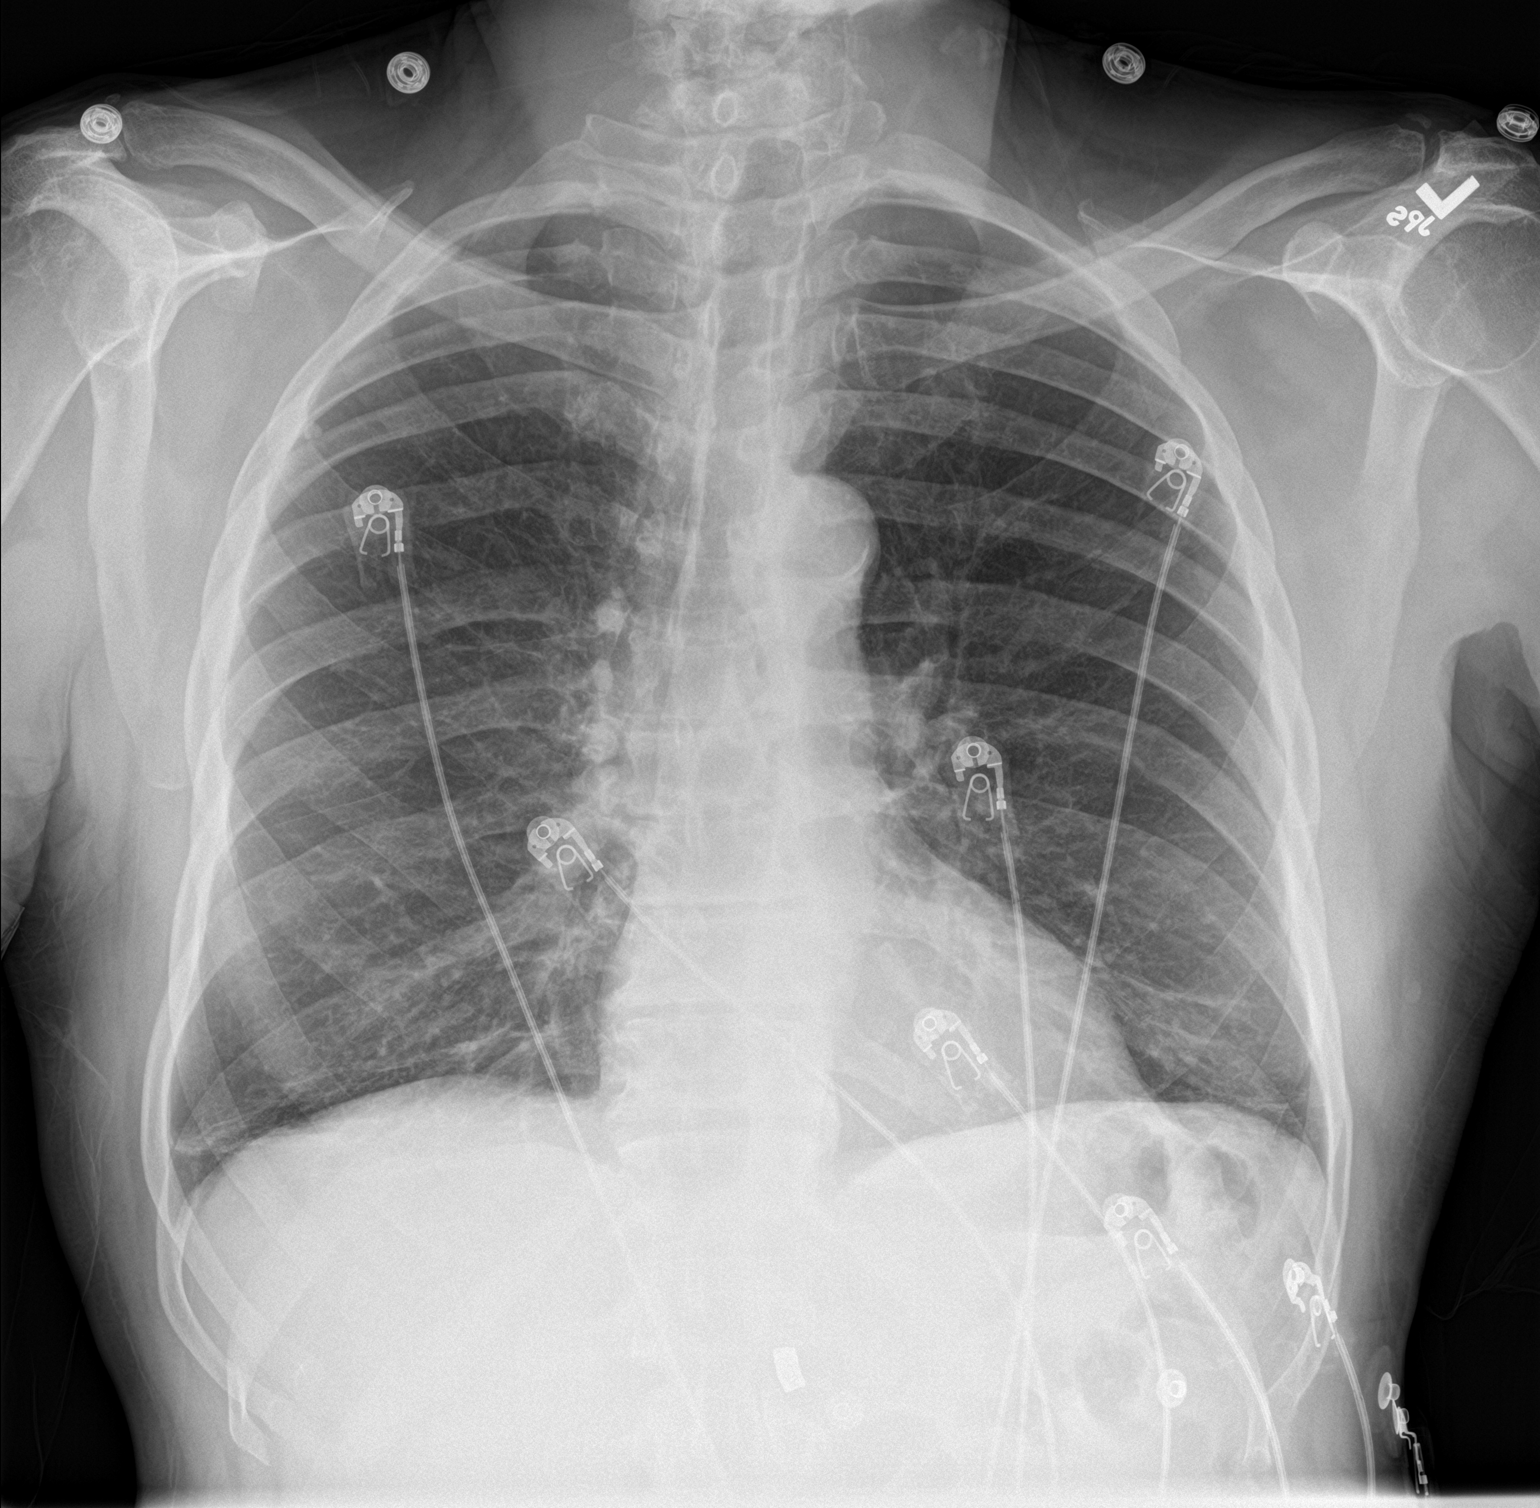

[chest lat]
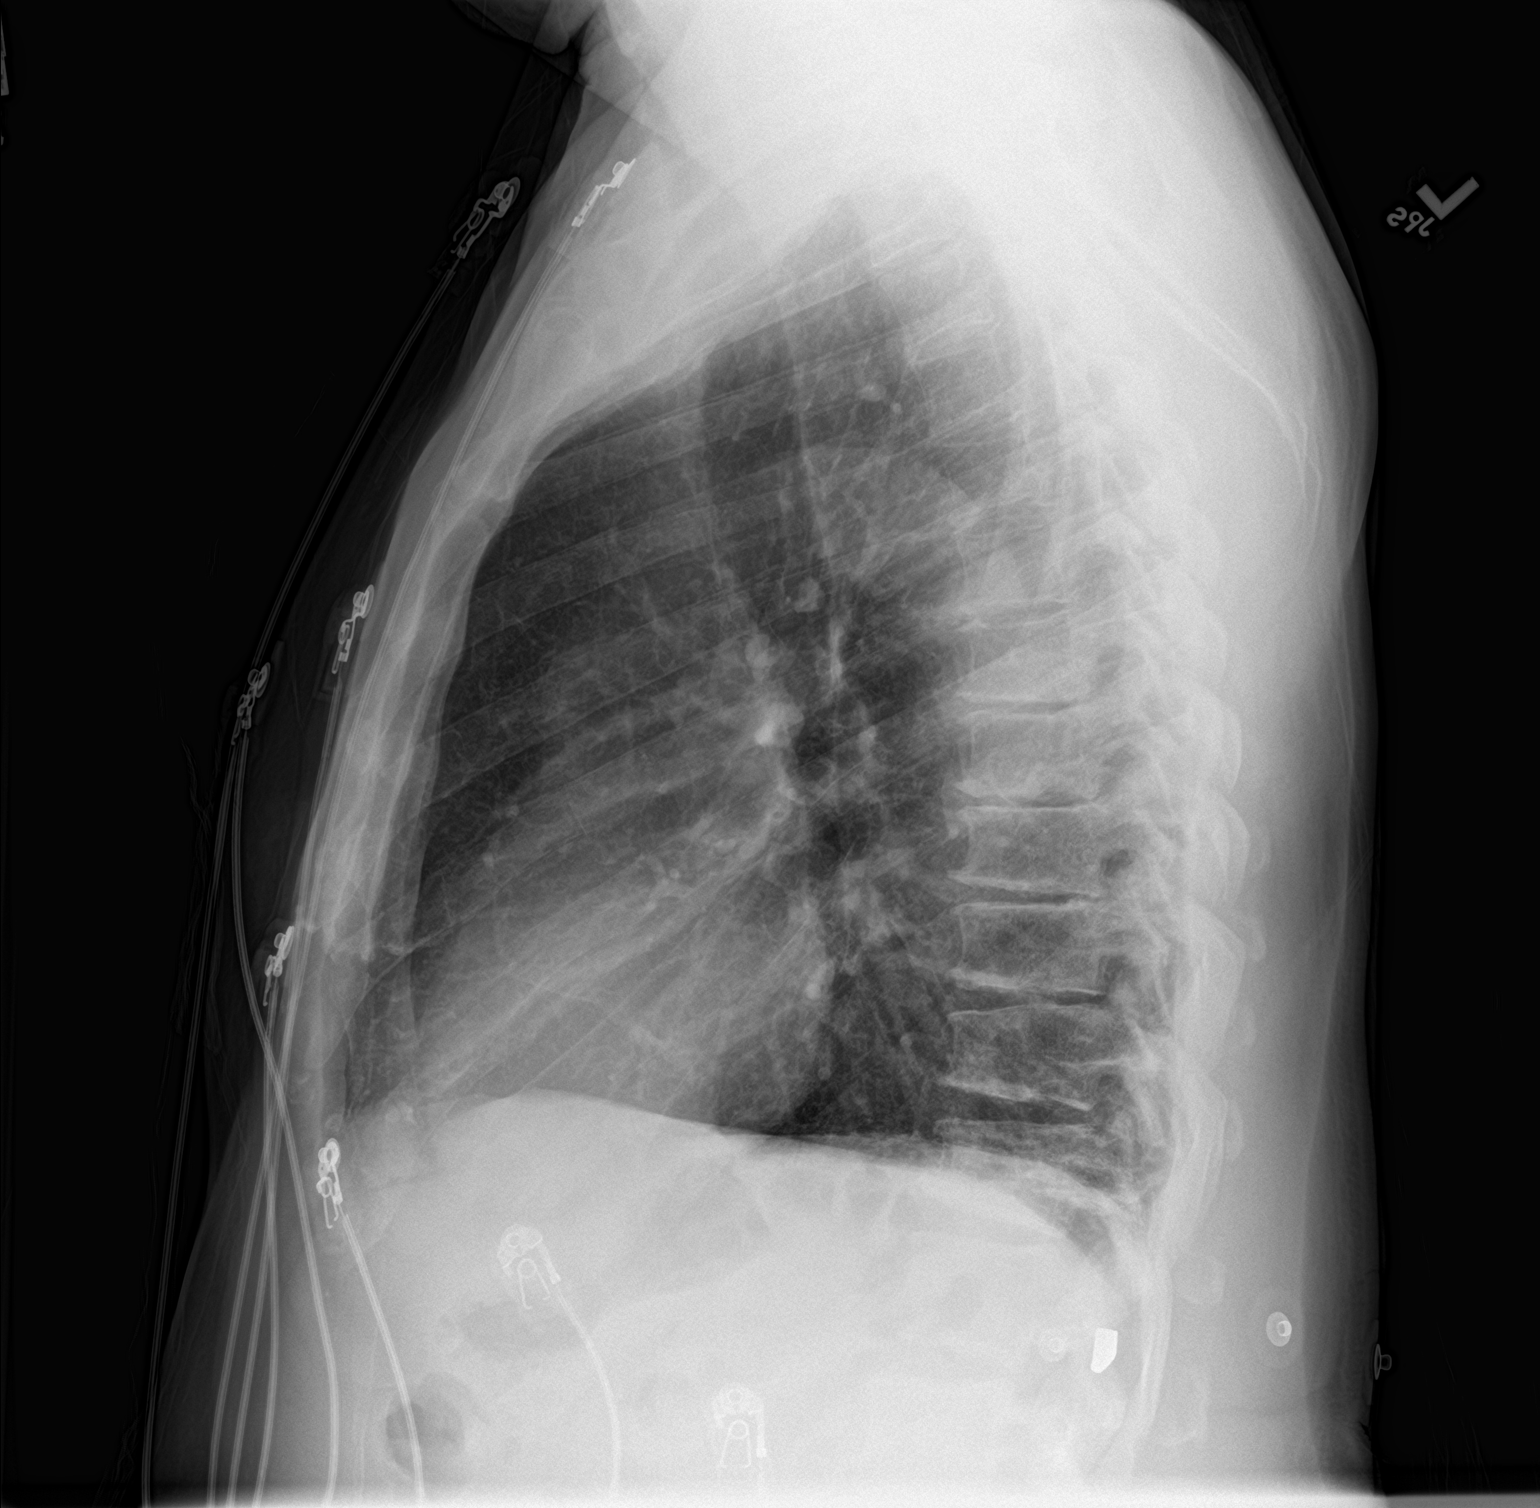

[2 of 2 positions shown; findings below may reference images not displayed]

FINDINGS: Mediastinum and hilar structures normal. Low lung volumes with mild
bibasilar atelectasis. No pleural effusion pneumothorax. Heart size
normal. Degenerative changes and scoliosis thoracic spine. Mild
lower thoracic vertebral body compression fractures, age
undetermined.
IMPRESSION: 1.  Low lung volumes with mild bibasilar atelectasis.

2. Mild lower thoracic vertebral body compression fractures, age
undetermined.
